# Patient Record
Sex: Male | Born: 1955
Health system: Southern US, Community
[De-identification: ages and names within clinical notes are randomized; demographics above are authoritative.]

## PROBLEM LIST (undated history)

## (undated) DIAGNOSIS — E785 Hyperlipidemia, unspecified: Secondary | ICD-10-CM

## (undated) DIAGNOSIS — I10 Essential (primary) hypertension: Secondary | ICD-10-CM

## (undated) DIAGNOSIS — Z720 Tobacco use: Secondary | ICD-10-CM

## (undated) HISTORY — DX: Hyperlipidemia, unspecified: E78.5

## (undated) HISTORY — DX: Tobacco use: Z72.0

## (undated) HISTORY — DX: Essential (primary) hypertension: I10

---

## 2016-11-25 ENCOUNTER — Observation Stay (HOSPITAL_COMMUNITY)
Admission: EM | Admit: 2016-11-25 | Discharge: 2016-11-26 | Disposition: A | Discharge status: Home / Self Care | Payer: Self-pay | Attending: Internal Medicine | Admitting: Internal Medicine

## 2016-11-25 ENCOUNTER — Emergency Department (HOSPITAL_COMMUNITY): Payer: Self-pay

## 2016-11-25 ENCOUNTER — Encounter (HOSPITAL_COMMUNITY): Payer: Self-pay | Admitting: Internal Medicine

## 2016-11-25 DIAGNOSIS — R55 Syncope and collapse: Secondary | ICD-10-CM

## 2016-11-25 DIAGNOSIS — Z7982 Long term (current) use of aspirin: Secondary | ICD-10-CM | POA: Insufficient documentation

## 2016-11-25 DIAGNOSIS — R0789 Other chest pain: Principal | ICD-10-CM

## 2016-11-25 DIAGNOSIS — I10 Essential (primary) hypertension: Secondary | ICD-10-CM | POA: Insufficient documentation

## 2016-11-25 DIAGNOSIS — Y92410 Unspecified street and highway as the place of occurrence of the external cause: Secondary | ICD-10-CM | POA: Insufficient documentation

## 2016-11-25 DIAGNOSIS — F17210 Nicotine dependence, cigarettes, uncomplicated: Secondary | ICD-10-CM

## 2016-11-25 DIAGNOSIS — R030 Elevated blood-pressure reading, without diagnosis of hypertension: Secondary | ICD-10-CM

## 2016-11-25 DIAGNOSIS — F1010 Alcohol abuse, uncomplicated: Secondary | ICD-10-CM | POA: Insufficient documentation

## 2016-11-25 DIAGNOSIS — Z8249 Family history of ischemic heart disease and other diseases of the circulatory system: Secondary | ICD-10-CM | POA: Insufficient documentation

## 2016-11-25 DIAGNOSIS — Z7289 Other problems related to lifestyle: Secondary | ICD-10-CM

## 2016-11-25 DIAGNOSIS — Z720 Tobacco use: Secondary | ICD-10-CM

## 2016-11-25 DIAGNOSIS — Y9389 Activity, other specified: Secondary | ICD-10-CM | POA: Insufficient documentation

## 2016-11-25 DIAGNOSIS — F17200 Nicotine dependence, unspecified, uncomplicated: Secondary | ICD-10-CM | POA: Insufficient documentation

## 2016-11-25 DIAGNOSIS — R4020 Unspecified coma: Secondary | ICD-10-CM | POA: Diagnosis present

## 2016-11-25 DIAGNOSIS — Z789 Other specified health status: Secondary | ICD-10-CM

## 2016-11-25 LAB — BASIC METABOLIC PANEL
ANION GAP: 12 (ref 5–15)
BUN: 9 mg/dL (ref 6–20)
CO2: 25 mmol/L (ref 22–32)
Calcium: 9.7 mg/dL (ref 8.9–10.3)
Chloride: 107 mmol/L (ref 101–111)
Creatinine, Ser: 0.72 mg/dL (ref 0.61–1.24)
Glucose, Bld: 86 mg/dL (ref 65–99)
POTASSIUM: 3.7 mmol/L (ref 3.5–5.1)
SODIUM: 144 mmol/L (ref 135–145)

## 2016-11-25 LAB — CBC
HEMATOCRIT: 45.4 % (ref 39.0–52.0)
Hemoglobin: 15.2 g/dL (ref 13.0–17.0)
MCH: 30.4 pg (ref 26.0–34.0)
MCHC: 33.5 g/dL (ref 30.0–36.0)
MCV: 90.8 fL (ref 78.0–100.0)
Platelets: 231 10*3/uL (ref 150–400)
RBC: 5 MIL/uL (ref 4.22–5.81)
RDW: 13.5 % (ref 11.5–15.5)
WBC: 6 10*3/uL (ref 4.0–10.5)

## 2016-11-25 LAB — I-STAT TROPONIN, ED: Troponin i, poc: 0 ng/mL (ref 0.00–0.08)

## 2016-11-25 LAB — ETHANOL: Alcohol, Ethyl (B): <5 mg/dL (ref <5)

## 2016-11-25 LAB — TROPONIN I

## 2016-11-25 MED ORDER — HYDROCHLOROTHIAZIDE 12.5 MG PO CAPS
12.5000 mg | ORAL_CAPSULE | Freq: Every day | ORAL | Status: DC
  Administered 2016-11-25 – 2016-11-26 (×2): 12.5 mg via ORAL
  Filled 2016-11-25 (×2): qty 1

## 2016-11-25 MED ORDER — ENOXAPARIN SODIUM 40 MG/0.4ML ~~LOC~~ SOLN
40.0000 mg | SUBCUTANEOUS | Status: DC
  Administered 2016-11-26: 40 mg via SUBCUTANEOUS
  Filled 2016-11-25: qty 0.4

## 2016-11-25 MED ORDER — ADULT MULTIVITAMIN W/MINERALS CH
1.0000 | ORAL_TABLET | Freq: Every day | ORAL | Status: DC
  Administered 2016-11-25 – 2016-11-26 (×2): 1 via ORAL
  Filled 2016-11-25 (×2): qty 1

## 2016-11-25 MED ORDER — LORAZEPAM 1 MG PO TABS: 1.0000 mg | ORAL_TABLET | Freq: Four times a day (QID) | ORAL | Status: DC | PRN

## 2016-11-25 MED ORDER — FOLIC ACID 1 MG PO TABS
1.0000 mg | ORAL_TABLET | Freq: Every day | ORAL | Status: DC
  Administered 2016-11-25 – 2016-11-26 (×2): 1 mg via ORAL
  Filled 2016-11-25 (×2): qty 1

## 2016-11-25 MED ORDER — THIAMINE HCL 100 MG/ML IJ SOLN: 100.0000 mg | Freq: Every day | INTRAMUSCULAR | Status: DC

## 2016-11-25 MED ORDER — ASPIRIN EC 81 MG PO TBEC
81.0000 mg | DELAYED_RELEASE_TABLET | Freq: Once | ORAL | Status: AC
  Administered 2016-11-25: 81 mg via ORAL
  Filled 2016-11-25: qty 1

## 2016-11-25 MED ORDER — NAPROXEN SODIUM 550 MG PO TABS
550.0000 mg | ORAL_TABLET | Freq: Two times a day (BID) | ORAL | Status: DC
  Administered 2016-11-25 – 2016-11-26 (×2): 550 mg via ORAL
  Filled 2016-11-25 (×3): qty 1

## 2016-11-25 MED ORDER — DICLOFENAC SODIUM 1 % TD GEL
2.0000 g | Freq: Four times a day (QID) | TRANSDERMAL | Status: DC
  Filled 2016-11-25: qty 100

## 2016-11-25 MED ORDER — LORAZEPAM 2 MG/ML IJ SOLN: 1.0000 mg | Freq: Four times a day (QID) | INTRAMUSCULAR | Status: DC | PRN

## 2016-11-25 MED ORDER — VITAMIN B-1 100 MG PO TABS
100.0000 mg | ORAL_TABLET | Freq: Every day | ORAL | Status: DC
  Administered 2016-11-25 – 2016-11-26 (×2): 100 mg via ORAL
  Filled 2016-11-25 (×2): qty 1

## 2016-11-25 MED ORDER — NICOTINE 14 MG/24HR TD PT24
14.0000 mg | MEDICATED_PATCH | Freq: Every day | TRANSDERMAL | Status: DC
  Administered 2016-11-25 – 2016-11-26 (×2): 14 mg via TRANSDERMAL
  Filled 2016-11-25 (×2): qty 1
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
## 2016-11-26 DIAGNOSIS — I10 Essential (primary) hypertension: Secondary | ICD-10-CM

## 2016-11-26 DIAGNOSIS — F1410 Cocaine abuse, uncomplicated: Secondary | ICD-10-CM

## 2016-11-26 LAB — URINALYSIS, ROUTINE W REFLEX MICROSCOPIC
BACTERIA UA: NONE SEEN
BILIRUBIN URINE: NEGATIVE
Glucose, UA: 50 mg/dL — AB
Ketones, ur: NEGATIVE mg/dL
LEUKOCYTES UA: NEGATIVE
NITRITE: NEGATIVE
Protein, ur: NEGATIVE mg/dL
SQUAMOUS EPITHELIAL / LPF: NONE SEEN
Specific Gravity, Urine: 1.019 (ref 1.005–1.030)
pH: 7 (ref 5.0–8.0)

## 2016-11-26 LAB — HEPATITIS C ANTIBODY (REFLEX)

## 2016-11-26 LAB — HCV COMMENT:

## 2016-11-26 LAB — HIV ANTIBODY (ROUTINE TESTING W REFLEX): HIV Screen 4th Generation wRfx: NONREACTIVE

## 2016-11-26 LAB — RAPID URINE DRUG SCREEN, HOSP PERFORMED
AMPHETAMINES: NOT DETECTED
Barbiturates: NOT DETECTED
Benzodiazepines: NOT DETECTED
COCAINE: POSITIVE — AB
OPIATES: NOT DETECTED
TETRAHYDROCANNABINOL: NOT DETECTED

## 2016-11-26 LAB — TROPONIN I

## 2016-11-26 LAB — HEMOGLOBIN A1C
Hgb A1c MFr Bld: 5.5 % (ref 4.8–5.6)
Mean Plasma Glucose: 111 mg/dL

## 2016-11-26 MED ORDER — ASPIRIN EC 81 MG PO TBEC: 81.0000 mg | DELAYED_RELEASE_TABLET | Freq: Every day | ORAL | 0 refills | Status: DC

## 2016-11-26 MED ORDER — ADULT MULTIVITAMIN W/MINERALS CH: 1.0000 | ORAL_TABLET | Freq: Every day | ORAL | 0 refills | Status: DC

## 2016-11-26 MED ORDER — HYDROCHLOROTHIAZIDE 12.5 MG PO CAPS
12.5000 mg | ORAL_CAPSULE | Freq: Every day | ORAL | 0 refills | Status: DC
Start: 2016-11-27 — End: 2016-12-23

## 2016-11-26 NOTE — Progress Notes (Signed)
Patient alert and oriented, denies pain, no shortness of breath. Iv and tele d/c. D/c instruction explain and given to the patient, all questions answered. Pt. D/c home with family per order.

## 2016-11-26 NOTE — Discharge Instructions (Signed)
As we discussed please make an appointment with internal medicine clinic located at ground floor of [facility]. I am starting you on a new medicine for your blood pressure and a baby aspirin every day. As we told you, you are at high risk for any heart problem, which needs to be investigated and treated before it causes any damage. Please follow the advice of your primary care.
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
## 2016-11-26 NOTE — Progress Notes (Signed)
Patient rested well overnight. Patient had no episodes of syncope. Patient ambulated to bathroom and tolerated well. Orthostatic vitals WNL. Patient in bed resting.

## 2016-11-29 ENCOUNTER — Telehealth: Payer: Self-pay | Admitting: Internal Medicine

## 2016-11-29 NOTE — Telephone Encounter (Signed)
HFU Appt on 12/12/2016 @ 10:45am per Dr. Nelson ChimesAmin with [facility].

## 2016-11-30 NOTE — Telephone Encounter (Signed)
LM to call back.
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
## 2016-12-12 ENCOUNTER — Ambulatory Visit (INDEPENDENT_AMBULATORY_CARE_PROVIDER_SITE_OTHER): Payer: Self-pay | Admitting: Internal Medicine

## 2016-12-12 VITALS — BP 134/77 | HR 68 | Temp 98.4°F | Wt 165.2 lb

## 2016-12-12 DIAGNOSIS — E782 Mixed hyperlipidemia: Secondary | ICD-10-CM

## 2016-12-12 DIAGNOSIS — Z09 Encounter for follow-up examination after completed treatment for conditions other than malignant neoplasm: Secondary | ICD-10-CM

## 2016-12-12 DIAGNOSIS — Z87898 Personal history of other specified conditions: Secondary | ICD-10-CM

## 2016-12-12 DIAGNOSIS — F17210 Nicotine dependence, cigarettes, uncomplicated: Secondary | ICD-10-CM

## 2016-12-12 DIAGNOSIS — R0789 Other chest pain: Secondary | ICD-10-CM

## 2016-12-12 DIAGNOSIS — Z0000 Encounter for general adult medical examination without abnormal findings: Secondary | ICD-10-CM | POA: Insufficient documentation

## 2016-12-12 DIAGNOSIS — I10 Essential (primary) hypertension: Secondary | ICD-10-CM

## 2016-12-12 DIAGNOSIS — E785 Hyperlipidemia, unspecified: Secondary | ICD-10-CM

## 2016-12-12 DIAGNOSIS — Z720 Tobacco use: Secondary | ICD-10-CM
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
## 2016-12-12 NOTE — Patient Instructions (Addendum)
It was a pleasure meeting you today! I'm glad you are feeling better since discharge.   1. Today we talked about your chest pain. I'm glad you havent had any more since discharge. Please continue taking your blood pressure medication and aspirin. We may need to get a stress test once you have insurance.  2. Today we also talked about your smoking. Please continue trying to quit! 3. I will be obtaining a lipid panel for you today. Please come back in 1-2 months.

## 2016-12-12 NOTE — Assessment & Plan Note (Addendum)
Encouraged cessation and offered nicotine replacement therapy. Endorses 50+ year history of tobacco abuse. Is presently trying to cut back slowly, currently smoking 1/2 pack per day.

## 2016-12-12 NOTE — Assessment & Plan Note (Signed)
Patient has never had colonoscopy. Will need to be scheduled for one once he obtains insurance.

## 2016-12-12 NOTE — Assessment & Plan Note (Signed)
Started on HCTZ 12.5 mg while hospitalized with good result. BP today at target of 134/77.  -Continue HCTZ 12.5 mg daily

## 2016-12-12 NOTE — Assessment & Plan Note (Signed)
No more CP since discharge. Will obtain lipid panel today to risk stratify patient for need of statin/asa therapy. In addition, he does not have insurance however could benefit from outpatient stress test in the next few months -FU lipid panel -Consider outpatient stress test in future

## 2016-12-13 DIAGNOSIS — E785 Hyperlipidemia, unspecified: Secondary | ICD-10-CM | POA: Insufficient documentation

## 2016-12-13 LAB — LIPID PANEL
CHOL/HDL RATIO: 2.3 ratio (ref 0.0–5.0)
Cholesterol, Total: 180 mg/dL (ref 100–199)
HDL: 78 mg/dL (ref >39)
LDL Calculated: 85 mg/dL (ref 0–99)
Triglycerides: 85 mg/dL (ref 0–149)
VLDL Cholesterol Cal: 17 mg/dL (ref 5–40)

## 2016-12-13 MED ORDER — ATORVASTATIN CALCIUM 10 MG PO TABS: 10.0000 mg | ORAL_TABLET | Freq: Every day | ORAL | 5 refills | Status: DC

## 2016-12-13 MED FILL — ATORVASTATIN 10 MG TABLET: 10 | 30 days supply | Qty: 30 | Fill #0

## 2016-12-13 NOTE — Assessment & Plan Note (Addendum)
ASCVD risk 20%. Will be started on moderate intensity statin therapy with Atorvastatin 10 mg daily. These results and plan were discussed with Mr. Cameron Bates 12/13/16 over the phone.   Ref. Range 12/12/2016 09:23  Total CHOL/HDL Ratio Latest Ref Range: 0.0 - 5.0 ratio units 2.3  Cholesterol, Total Latest Ref Range: 100 - 199 mg/dL 161180  HDL Cholesterol Latest Ref Range: >39 mg/dL 78  LDL (calc) Latest Ref Range: 0 - 99 mg/dL 85  Triglycerides Latest Ref Range: 0 - 149 mg/dL 85  VLDL Cholesterol Cal Latest Ref Range: 5 - 40 mg/dL 17

## 2016-12-13 NOTE — Progress Notes (Signed)
Internal Medicine Clinic Attending  Case discussed with Dr. Vincente LibertyMolt at the time of the visit.  We reviewed the resident's history and exam and pertinent patient test results.  I agree with the assessment, diagnosis, and plan of care documented in the resident's note. It appears there was some concern for a cardiac origin of his chest pain when he was an inpatient and he was started on 81mg  of Asprin.  But was not started on a lipid lowering medication.  We obtained a Lipid panel today to help with risk stratification and based on the results Dr Vincente LibertyMolt has started a stain medication, we will need to repeat a lipid panel in about 6 weeks.  It was recommended that he obtain a cardiac stress test however he cannot afford this at this time, therefore we will continue the 81 mg of aspirin.  We will also continue to work on smoking cessation.

## 2016-12-13 NOTE — Addendum Note (Signed)
Addended by: Mable FillMOLT, Geovani Tootle L on: 12/13/2016 09:53 AM   Modules accepted: Orders

## 2016-12-23 ENCOUNTER — Other Ambulatory Visit: Payer: Self-pay | Admitting: Internal Medicine

## 2017-02-08 ENCOUNTER — Other Ambulatory Visit: Payer: Self-pay | Admitting: Internal Medicine

## 2017-02-11 ENCOUNTER — Other Ambulatory Visit: Payer: Self-pay | Admitting: Internal Medicine

## 2017-08-03 ENCOUNTER — Other Ambulatory Visit: Payer: Self-pay | Admitting: Internal Medicine

## 2017-08-25 ENCOUNTER — Other Ambulatory Visit: Payer: Self-pay

## 2017-08-25 ENCOUNTER — Ambulatory Visit (INDEPENDENT_AMBULATORY_CARE_PROVIDER_SITE_OTHER): Payer: Self-pay | Admitting: Internal Medicine

## 2017-08-25 VITALS — BP 127/90 | HR 60 | Temp 97.5°F | Ht 72.0 in | Wt 163.8 lb

## 2017-08-25 DIAGNOSIS — R55 Syncope and collapse: Secondary | ICD-10-CM | POA: Insufficient documentation

## 2017-08-25 DIAGNOSIS — Z720 Tobacco use: Secondary | ICD-10-CM

## 2017-08-25 DIAGNOSIS — F17210 Nicotine dependence, cigarettes, uncomplicated: Secondary | ICD-10-CM

## 2017-08-25 DIAGNOSIS — I10 Essential (primary) hypertension: Secondary | ICD-10-CM

## 2017-08-25 MED ORDER — HYDROCHLOROTHIAZIDE 12.5 MG PO CAPS: 12.5000 mg | ORAL_CAPSULE | Freq: Every day | ORAL | 2 refills | Status: DC

## 2017-08-25 MED ORDER — ASPIRIN 81 MG PO TBEC: 81.0000 mg | DELAYED_RELEASE_TABLET | Freq: Every day | ORAL | 2 refills | Status: DC

## 2017-08-25 MED ORDER — ATORVASTATIN CALCIUM 10 MG PO TABS: 10.0000 mg | ORAL_TABLET | Freq: Every day | ORAL | 2 refills | Status: DC

## 2017-08-25 MED ORDER — NICOTINE 7 MG/24HR TD PT24: 7.0000 mg | MEDICATED_PATCH | Freq: Every day | TRANSDERMAL | 0 refills | Status: DC

## 2017-08-25 MED ORDER — NICOTINE POLACRILEX 2 MG MT GUM: 2.0000 mg | CHEWING_GUM | OROMUCOSAL | 0 refills | Status: DC | PRN

## 2017-08-25 NOTE — Assessment & Plan Note (Addendum)
Patient reports that 2 weeks ago he had an episode of syncope. States that he woke up at night and went to the bathroom. During urination he became lightheaded and dizzy and lost consciousness. Denies chest pain, palpitation, or feeling lightheaded or dizzy before micturition. Denies any tongue biting, loss of bowel or bladder, post-ictal symptoms. Has not had any further symptoms.  A/P Situational syncope with micturition. No further work up warranted at this time.

## 2017-08-25 NOTE — Progress Notes (Signed)
   CC: HTN follow up  HPI:  Mr.Giuseppe Rande Lawmanrwin is a 61 y.o. male with a past medical history listed below here today for follow up of his HTN.   For details of today's visit and the status of his chronic medical issues please refer to the assessment and plan.   Past Medical History:  Diagnosis Date  . Hypertension   . Tobacco abuse     Review of Systems:   No chest pain, shortness of breath, or palpitations  Physical Exam:  Vitals:   08/25/17 0957  BP: 127/90  Pulse: 60  Temp: (!) 97.5 F (36.4 C)  TempSrc: Oral  SpO2: 100%  Weight: 163 lb 12.8 oz (74.3 kg)  Height: 6' (1.829 m)   Physical Exam  Constitutional: He is oriented to person, place, and time and well-developed, well-nourished, and in no distress. No distress.  HENT:  Head: Normocephalic and atraumatic.  Cardiovascular: Normal rate, regular rhythm and normal heart sounds.  Pulmonary/Chest: Effort normal and breath sounds normal.  Abdominal: Soft. Bowel sounds are normal.  Neurological: He is alert and oriented to person, place, and time.  Skin: Skin is warm and dry.  Psychiatric: Mood and affect normal.    Assessment & Plan:   See Encounters Tab for problem based charting.  Patient discussed with Dr. Oswaldo DoneVincent

## 2017-08-25 NOTE — Assessment & Plan Note (Addendum)
BP Readings from Last 3 Encounters:  08/25/17 127/90  12/12/16 134/77  11/26/16 112/66    Lab Results  Component Value Date   NA 144 11/25/2016   K 3.7 11/25/2016   CREATININE 0.72 11/25/2016   BP well controlled today. Currently only taking HCTZ 12.5 mg daily. Denies any chest pain, shortness of breath, palpitations, nausea/vomting. Reports compliance with his medications. Has not had repeat BMET check after starting the HCTZ.  A/P: Continue current medications Check BMET today

## 2017-08-25 NOTE — Assessment & Plan Note (Signed)
Reports smoking 1/2 PPD. Does endorse a desire to quit today. Reports he has never tried to quit in the past and has never tried anything.   Discussed strategies for quitting. Was interested in nicotine patches and gum. Provided information and gave him information on 1-800-quit-now.

## 2017-08-25 NOTE — Patient Instructions (Signed)
Mr. Cameron Bates,  I'm glad you are doing well. We will continue your current medications.   Please follow up with your regular doctor in about 6 months.

## 2017-08-26 LAB — BMP8+ANION GAP
ANION GAP: 18 mmol/L (ref 10.0–18.0)
BUN/Creatinine Ratio: 16 (ref 10–24)
BUN: 12 mg/dL (ref 8–27)
CALCIUM: 10 mg/dL (ref 8.6–10.2)
CO2: 24 mmol/L (ref 20–29)
CREATININE: 0.73 mg/dL — AB (ref 0.76–1.27)
Chloride: 101 mmol/L (ref 96–106)
GFR calc Af Amer: 116 mL/min/{1.73_m2} (ref >59)
GFR, EST NON AFRICAN AMERICAN: 100 mL/min/{1.73_m2} (ref >59)
GLUCOSE: 85 mg/dL (ref 65–99)
Potassium: 3.9 mmol/L (ref 3.5–5.2)
Sodium: 143 mmol/L (ref 134–144)

## 2017-08-28 NOTE — Progress Notes (Signed)
Internal Medicine Clinic Attending  Case discussed with Dr. Boswell at the time of the visit.  We reviewed the resident's history and exam and pertinent patient test results.  I agree with the assessment, diagnosis, and plan of care documented in the resident's note.  

## 2017-10-15 ENCOUNTER — Other Ambulatory Visit: Payer: Self-pay | Admitting: Internal Medicine

## 2017-10-18 NOTE — Telephone Encounter (Signed)
Refill Approved
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
## 2018-06-27 NOTE — Congregational Nurse Program (Signed)
Congregational Nurse Program Note  Date of Encounter: 06/27/2018  Past Medical History: Past Medical History:  Diagnosis Date  . Hypertension   . Tobacco abuse     Encounter Details:  In for blood pressure check and information given on orange card and smoking cessation classes . Client very acceptable! Follow weekly!

## 2018-07-05 NOTE — Congregational Nurse Program (Signed)
Congregational Nurse Program Note  Date of Encounter: 07/04/2018  Past Medical History: Past Medical History:  Diagnosis Date  . Hypertension   . Tobacco abuse     Encounter Details: CNP Questionnaire - 07/05/18 0026      Questionnaire   Patient Status  Not Applicable    Race  Black or African American    Location Patient Served At  Not Applicable    Insurance  Not Applicable    Uninsured  Uninsured (Subsequent visits/quarter)    Food  No food insecurities    Housing/Utilities  Yes, have permanent housing    Transportation  Within past 12 months, lack of transportation negatively impacted life    Interpersonal Safety  Yes, feel physically and emotionally safe where you currently live    Medication  Yes, have medication insecurities    Medical Provider  No    Referrals  Primary Care Provider/Clinic;Area Agency    ED Visit Averted  Not Applicable    Life-Saving Intervention Made  Not Applicable       Congregational Nurse Program Note  Date of Encounter: 07/04/2018  Past Medical History: Past Medical History:  Diagnosis Date  . Hypertension   . Tobacco abuse     Encounter Details: Client in for blood pressure check states he has been okay. Followed up with applying for orange card but need his W2 from 2018. To see if case manager can help him on Internet to secure that documentation. Appointment with PCP next week . Has  Interview for a job at [employer] on tomorrow and hopes he secures it.  To follow up with nurse next week . Blood pressure good ,states he isn' smoking as much because he cant afford the cigarettes and laughed but felt that is good for him and helping him to decrease the amount of smoking.

## 2018-07-13 ENCOUNTER — Ambulatory Visit: Payer: Self-pay | Admitting: Family Medicine

## 2018-11-24 ENCOUNTER — Encounter (HOSPITAL_COMMUNITY): Payer: Self-pay | Admitting: Emergency Medicine

## 2018-11-24 ENCOUNTER — Other Ambulatory Visit: Payer: Self-pay

## 2018-11-24 ENCOUNTER — Emergency Department (HOSPITAL_COMMUNITY)
Admission: EM | Admit: 2018-11-24 | Discharge: 2018-11-24 | Disposition: A | Discharge status: Home / Self Care | Payer: No Typology Code available for payment source | Attending: Emergency Medicine | Admitting: Emergency Medicine

## 2018-11-24 ENCOUNTER — Emergency Department (HOSPITAL_COMMUNITY): Payer: No Typology Code available for payment source

## 2018-11-24 DIAGNOSIS — R69 Illness, unspecified: Secondary | ICD-10-CM

## 2018-11-24 DIAGNOSIS — J111 Influenza due to unidentified influenza virus with other respiratory manifestations: Secondary | ICD-10-CM | POA: Insufficient documentation

## 2018-11-24 DIAGNOSIS — F17210 Nicotine dependence, cigarettes, uncomplicated: Secondary | ICD-10-CM | POA: Insufficient documentation

## 2018-11-24 DIAGNOSIS — I10 Essential (primary) hypertension: Secondary | ICD-10-CM | POA: Insufficient documentation

## 2018-11-24 LAB — URINALYSIS, ROUTINE W REFLEX MICROSCOPIC
Bilirubin Urine: NEGATIVE
Glucose, UA: NEGATIVE mg/dL
Ketones, ur: NEGATIVE mg/dL
Leukocytes, UA: NEGATIVE
Nitrite: NEGATIVE
Protein, ur: 30 mg/dL — AB
Specific Gravity, Urine: 1.014 (ref 1.005–1.030)
pH: 5 (ref 5.0–8.0)

## 2018-11-24 LAB — COMPREHENSIVE METABOLIC PANEL
ALT: 22 U/L (ref 0–44)
AST: 45 U/L — ABNORMAL HIGH (ref 15–41)
Albumin: 4.1 g/dL (ref 3.5–5.0)
Alkaline Phosphatase: 47 U/L (ref 38–126)
Anion gap: 13 (ref 5–15)
BUN: 15 mg/dL (ref 8–23)
CO2: 21 mmol/L — ABNORMAL LOW (ref 22–32)
Calcium: 8.9 mg/dL (ref 8.9–10.3)
Chloride: 99 mmol/L (ref 98–111)
Creatinine, Ser: 1.69 mg/dL — ABNORMAL HIGH (ref 0.61–1.24)
GFR calc Af Amer: 49 mL/min — ABNORMAL LOW (ref >60)
GFR calc non Af Amer: 42 mL/min — ABNORMAL LOW (ref >60)
Glucose, Bld: 123 mg/dL — ABNORMAL HIGH (ref 70–99)
Potassium: 3.4 mmol/L — ABNORMAL LOW (ref 3.5–5.1)
Sodium: 133 mmol/L — ABNORMAL LOW (ref 135–145)
Total Bilirubin: 1 mg/dL (ref 0.3–1.2)
Total Protein: 7.8 g/dL (ref 6.5–8.1)

## 2018-11-24 LAB — CBC WITH DIFFERENTIAL/PLATELET
Abs Immature Granulocytes: 0.05 10*3/uL (ref 0.00–0.07)
Basophils Absolute: 0 10*3/uL (ref 0.0–0.1)
Basophils Relative: 0 %
Eosinophils Absolute: 0 10*3/uL (ref 0.0–0.5)
Eosinophils Relative: 0 %
HCT: 45.2 % (ref 39.0–52.0)
Hemoglobin: 14.5 g/dL (ref 13.0–17.0)
Immature Granulocytes: 1 %
Lymphocytes Relative: 10 %
Lymphs Abs: 1 10*3/uL (ref 0.7–4.0)
MCH: 30.4 pg (ref 26.0–34.0)
MCHC: 32.1 g/dL (ref 30.0–36.0)
MCV: 94.8 fL (ref 80.0–100.0)
Monocytes Absolute: 0.6 10*3/uL (ref 0.1–1.0)
Monocytes Relative: 6 %
Neutro Abs: 8.1 10*3/uL — ABNORMAL HIGH (ref 1.7–7.7)
Neutrophils Relative %: 83 %
Platelets: 161 10*3/uL (ref 150–400)
RBC: 4.77 MIL/uL (ref 4.22–5.81)
RDW: 13.4 % (ref 11.5–15.5)
WBC: 9.7 10*3/uL (ref 4.0–10.5)
nRBC: 0 % (ref 0.0–0.2)

## 2018-11-24 LAB — CBG MONITORING, ED: Glucose-Capillary: 156 mg/dL — ABNORMAL HIGH (ref 70–99)

## 2018-11-24 MED ORDER — SODIUM CHLORIDE 0.9 % IV BOLUS
1000.0000 mL | Freq: Once | INTRAVENOUS | Status: AC
  Administered 2018-11-24: 1000 mL via INTRAVENOUS

## 2018-11-24 MED ORDER — PROMETHAZINE HCL 25 MG PO TABS: 25.0000 mg | ORAL_TABLET | Freq: Four times a day (QID) | ORAL | 0 refills | Status: DC | PRN

## 2018-11-24 NOTE — ED Notes (Signed)
Ambulated around desk x 2, tol well sats mid 90's with HR 85-95 BP 114/72 upon returning to room.

## 2018-11-24 NOTE — ED Triage Notes (Signed)
Pt c/o sore throat for couple days. Reports he past out at work couple days ago and vomited afterwards. Also had some diarrhea and headaches. Pt reports that had a cough and getting up sputum.

## 2018-11-24 NOTE — ED Notes (Signed)
Patient transported to X-ray 

## 2018-11-24 NOTE — ED Notes (Signed)
Pt refuses RN to hang IV fluids until fed.  Spoke with provider.  Able to give something to eat. Sandwich and drink provided.

## 2018-11-24 NOTE — ED Provider Notes (Signed)
[facility]-EMERGENCY DEPT Provider Note   CSN: [id number] Arrival date & time: 11/24/18  3646     History   Chief Complaint Chief Complaint  Patient presents with  . Sore Throat  . multiple complaints    HPI Cameron Bates is a 63 y.o. male.  HPI Patient presents to the emergency department with cough, body aches nausea and vomiting.  The patient states he has had one episode of diarrhea yesterday.  Patient states that the symptoms started 2 days ago.  Patient states that yesterday while he was standing in line yesterday afternoon he passed out for a few seconds.  Patient states that he said that happened to him in the past.  Patient states he feels some better today but was slightly dizzy.  Patient states he is not eaten in 2 days.  The patient denies chest pain, shortness of breath, headache,blurred vision, neck pain, fever,  weakness, numbness, dizziness, anorexia, edema, abdominal pain, diarrhea, rash, back pain, dysuria, hematemesis, bloody stool,  Past Medical History:  Diagnosis Date  . Hypertension   . Tobacco abuse     Patient Active Problem List   Diagnosis Date Noted  . Situational syncope 08/25/2017  . Hyperlipidemia 12/13/2016  . Healthcare maintenance 12/12/2016  . Essential hypertension 12/12/2016  . Loss of consciousness (HCC) 11/25/2016  . Tobacco use 11/25/2016  . Alcohol use 11/25/2016  . Chest wall pain 11/25/2016    History reviewed. No pertinent surgical history.      Home Medications    Prior to Admission medications   Medication Sig Start Date End Date Taking? Authorizing Provider  aspirin 81 MG EC tablet Take 1 tablet (81 mg total) daily by mouth. Swallow whole. Patient not taking: Reported on 11/24/2018 08/25/17   Valentino Nose, MD  atorvastatin (LIPITOR) 10 MG tablet Take 1 tablet (10 mg total) daily by mouth. 08/25/17 05/22/18  Valentino Nose, MD  hydrochlorothiazide (MICROZIDE) 12.5 MG capsule Take 1 capsule (12.5 mg  total) daily by mouth. Patient not taking: Reported on 11/24/2018 08/25/17   Valentino Nose, MD  Multiple Vitamin (MULTIVITAMIN WITH MINERALS) TABS tablet Take 1 tablet by mouth daily. Patient not taking: Reported on 11/24/2018 11/27/16   Arnetha Courser, MD  nicotine (NICODERM CQ - DOSED IN MG/24 HR) 7 mg/24hr patch Place 1 patch (7 mg total) daily onto the skin. Patient not taking: Reported on 11/24/2018 08/25/17   Valentino Nose, MD  nicotine polacrilex (NICORETTE) 2 MG gum TAKE 1 EACH (2 MG TOTAL) AS NEEDED BY MOUTH FOR SMOKING CESSATION. Patient not taking: Reported on 11/24/2018 10/18/17   Beola Cord, MD    Family History Family History  Problem Relation Age of Onset  . Hypertension Mother     Social History Social History   Tobacco Use  . Smoking status: Current Every Day Smoker    Packs/day: 0.50    Years: 40.00    Pack years: 20.00    Types: Cigarettes  . Smokeless tobacco: Never Used  Substance Use Topics  . Alcohol use: Yes  . Drug use: No     Allergies   Patient has no known allergies.   Review of Systems Review of Systems All other systems negative except as documented in the HPI. All pertinent positives and negatives as reviewed in the HPI.  Physical Exam Updated Vital Signs BP 102/74 Comment: Sat 99 %  Pulse 74   Temp 98.7 F (37.1 C) (Oral)   Resp 17 Comment: Sat 99 %  Ht 6' (  1.829 m)   Wt 73.9 kg   SpO2 97%   BMI 22.11 kg/m   Physical Exam Vitals signs and nursing note reviewed.  Constitutional:      General: He is not in acute distress.    Appearance: He is well-developed.  HENT:     Head: Normocephalic and atraumatic.  Eyes:     Pupils: Pupils are equal, round, and reactive to light.  Neck:     Musculoskeletal: Normal range of motion and neck supple.  Cardiovascular:     Rate and Rhythm: Normal rate and regular rhythm.     Heart sounds: Normal heart sounds. No murmur. No friction rub. No gallop.   Pulmonary:     Effort: Pulmonary effort  is normal. No respiratory distress.     Breath sounds: Normal breath sounds. No wheezing.  Abdominal:     General: Bowel sounds are normal. There is no distension.     Palpations: Abdomen is soft.     Tenderness: There is no abdominal tenderness.  Skin:    General: Skin is warm and dry.     Capillary Refill: Capillary refill takes less than 2 seconds.     Findings: No erythema or rash.  Neurological:     Mental Status: He is alert and oriented to person, place, and time.     Motor: No abnormal muscle tone.     Coordination: Coordination normal.  Psychiatric:        Behavior: Behavior normal.      ED Treatments / Results  Labs (all labs ordered are listed, but only abnormal results are displayed) Labs Reviewed  COMPREHENSIVE METABOLIC PANEL - Abnormal; Notable for the following components:      Result Value   Sodium 133 (*)    Potassium 3.4 (*)    CO2 21 (*)    Glucose, Bld 123 (*)    Creatinine, Ser 1.69 (*)    AST 45 (*)    GFR calc non Af Amer 42 (*)    GFR calc Af Amer 49 (*)    All other components within normal limits  CBC WITH DIFFERENTIAL/PLATELET - Abnormal; Notable for the following components:   Neutro Abs 8.1 (*)    All other components within normal limits  URINALYSIS, ROUTINE W REFLEX MICROSCOPIC - Abnormal; Notable for the following components:   APPearance CLOUDY (*)    Hgb urine dipstick LARGE (*)    Protein, ur 30 (*)    Bacteria, UA RARE (*)    All other components within normal limits  CBG MONITORING, ED - Abnormal; Notable for the following components:   Glucose-Capillary 156 (*)    All other components within normal limits    EKG EKG Interpretation  Date/Time:  Saturday November 24 2018 07:05:30 EST Ventricular Rate:  101 PR Interval:    QRS Duration: 72 QT Interval:  291 QTC Calculation: 378 R Axis:   71 Text Interpretation:  Sinus tachycardia Biatrial enlargement Left ventricular hypertrophy Nonspecific T abnormalities, lateral leads  Confirmed by Lorre NickAllen, Anthony ([id number]) on 11/24/2018 10:29:28 AM   Radiology Dg Chest 2 View  Result Date: 11/24/2018 CLINICAL DATA:  Cough and sore throat EXAM: CHEST - 2 VIEW COMPARISON:  None. FINDINGS: Lungs are clear. Heart size and pulmonary vascularity are normal. No adenopathy. There is aortic atherosclerosis. No bone lesions. IMPRESSION: Aortic atherosclerosis.  No edema or consolidation. Electronically Signed   By: Bretta BangWilliam  Woodruff III M.D.   On: 11/24/2018 09:40    Procedures Procedures (  including critical care time)  Medications Ordered in ED Medications  sodium chloride 0.9 % bolus 1,000 mL (0 mLs Intravenous Stopped 11/24/18 0924)  sodium chloride 0.9 % bolus 1,000 mL (1,000 mLs Intravenous New Bag/Given 11/24/18 1236)     Initial Impression / Assessment and Plan / ED Course  I have reviewed the triage vital signs and the nursing notes.  Pertinent labs & imaging results that were available during my care of the patient were reviewed by me and considered in my medical decision making (see chart for details).     Patient is feeling much better following fluids and eating.  The patient states he would like to be discharged home.  The patient's blood pressures have been low but after he was ambulated and rechecked his blood pressure is 114.  The patient most likely has an influenza-like illness.  I feel that the 2 L of fluid will definitely help his condition.  His creatinine was elevated but I feel this is due to dehydration. Patient is advised to return here as needed.   Final Clinical Impressions(s) / ED Diagnoses   Final diagnoses:  None    ED Discharge Orders    None       Charlestine NightLawyer, Eilan Mcinerny, PA-C 11/24/18 1444    Lorre NickAllen, Anthony, MD 11/24/18 [phone]

## 2018-11-24 NOTE — ED Notes (Signed)
Pt given urinal and made aware that a urine sample is needed.

## 2018-11-24 NOTE — Discharge Instructions (Addendum)
Return here as needed.  Follow-up with the clinic provided.  Increase your fluid intake.

## 2018-11-25 ENCOUNTER — Emergency Department (HOSPITAL_COMMUNITY)
Admission: EM | Admit: 2018-11-25 | Discharge: 2018-11-25 | Disposition: A | Discharge status: Home / Self Care | Payer: Self-pay | Attending: Emergency Medicine | Admitting: Emergency Medicine

## 2018-11-25 ENCOUNTER — Emergency Department (HOSPITAL_COMMUNITY): Payer: Self-pay

## 2018-11-25 ENCOUNTER — Encounter (HOSPITAL_COMMUNITY): Payer: Self-pay | Admitting: Emergency Medicine

## 2018-11-25 ENCOUNTER — Other Ambulatory Visit: Payer: Self-pay

## 2018-11-25 DIAGNOSIS — Z79899 Other long term (current) drug therapy: Secondary | ICD-10-CM | POA: Insufficient documentation

## 2018-11-25 DIAGNOSIS — I10 Essential (primary) hypertension: Secondary | ICD-10-CM | POA: Insufficient documentation

## 2018-11-25 DIAGNOSIS — E860 Dehydration: Secondary | ICD-10-CM

## 2018-11-25 DIAGNOSIS — J101 Influenza due to other identified influenza virus with other respiratory manifestations: Secondary | ICD-10-CM | POA: Insufficient documentation

## 2018-11-25 DIAGNOSIS — R531 Weakness: Secondary | ICD-10-CM

## 2018-11-25 DIAGNOSIS — F17210 Nicotine dependence, cigarettes, uncomplicated: Secondary | ICD-10-CM | POA: Insufficient documentation

## 2018-11-25 DIAGNOSIS — R109 Unspecified abdominal pain: Secondary | ICD-10-CM | POA: Insufficient documentation

## 2018-11-25 LAB — CBC WITH DIFFERENTIAL/PLATELET
ABS IMMATURE GRANULOCYTES: 0.02 10*3/uL (ref 0.00–0.07)
Basophils Absolute: 0 10*3/uL (ref 0.0–0.1)
Basophils Relative: 0 %
Eosinophils Absolute: 0 10*3/uL (ref 0.0–0.5)
Eosinophils Relative: 0 %
HCT: 43.7 % (ref 39.0–52.0)
Hemoglobin: 13.3 g/dL (ref 13.0–17.0)
Immature Granulocytes: 0 %
Lymphocytes Relative: 14 %
Lymphs Abs: 0.8 10*3/uL (ref 0.7–4.0)
MCH: 28.9 pg (ref 26.0–34.0)
MCHC: 30.4 g/dL (ref 30.0–36.0)
MCV: 95 fL (ref 80.0–100.0)
MONO ABS: 0.4 10*3/uL (ref 0.1–1.0)
Monocytes Relative: 8 %
NEUTROS ABS: 4.2 10*3/uL (ref 1.7–7.7)
Neutrophils Relative %: 78 %
Platelets: 142 10*3/uL — ABNORMAL LOW (ref 150–400)
RBC: 4.6 MIL/uL (ref 4.22–5.81)
RDW: 13 % (ref 11.5–15.5)
WBC: 5.5 10*3/uL (ref 4.0–10.5)
nRBC: 0 % (ref 0.0–0.2)

## 2018-11-25 LAB — URINALYSIS, ROUTINE W REFLEX MICROSCOPIC
Bacteria, UA: NONE SEEN
Bilirubin Urine: NEGATIVE
Glucose, UA: NEGATIVE mg/dL
Ketones, ur: NEGATIVE mg/dL
Leukocytes, UA: NEGATIVE
Nitrite: NEGATIVE
Protein, ur: NEGATIVE mg/dL
Specific Gravity, Urine: 1.01 (ref 1.005–1.030)
pH: 5 (ref 5.0–8.0)

## 2018-11-25 LAB — COMPREHENSIVE METABOLIC PANEL
ALT: 25 U/L (ref 0–44)
AST: 43 U/L — ABNORMAL HIGH (ref 15–41)
Albumin: 3.2 g/dL — ABNORMAL LOW (ref 3.5–5.0)
Alkaline Phosphatase: 37 U/L — ABNORMAL LOW (ref 38–126)
Anion gap: 13 (ref 5–15)
BUN: 14 mg/dL (ref 8–23)
CHLORIDE: 98 mmol/L (ref 98–111)
CO2: 22 mmol/L (ref 22–32)
Calcium: 8.4 mg/dL — ABNORMAL LOW (ref 8.9–10.3)
Creatinine, Ser: 1.21 mg/dL (ref 0.61–1.24)
GFR calc Af Amer: >60 mL/min (ref >60)
GFR calc non Af Amer: >60 mL/min (ref >60)
Glucose, Bld: 104 mg/dL — ABNORMAL HIGH (ref 70–99)
POTASSIUM: 3.4 mmol/L — AB (ref 3.5–5.1)
Sodium: 133 mmol/L — ABNORMAL LOW (ref 135–145)
Total Bilirubin: 0.8 mg/dL (ref 0.3–1.2)
Total Protein: 6.7 g/dL (ref 6.5–8.1)

## 2018-11-25 LAB — INFLUENZA PANEL BY PCR (TYPE A & B)
Influenza A By PCR: POSITIVE — AB
Influenza B By PCR: NEGATIVE

## 2018-11-25 LAB — I-STAT TROPONIN, ED: Troponin i, poc: 0.01 ng/mL (ref 0.00–0.08)

## 2018-11-25 LAB — LACTIC ACID, PLASMA: LACTIC ACID, VENOUS: 1.1 mmol/L (ref 0.5–1.9)

## 2018-11-25 MED ORDER — IOHEXOL 300 MG/ML  SOLN
100.0000 mL | Freq: Once | INTRAMUSCULAR | Status: AC | PRN
  Administered 2018-11-25: 100 mL via INTRAVENOUS

## 2018-11-25 MED ORDER — OSELTAMIVIR PHOSPHATE 75 MG PO CAPS
75.0000 mg | ORAL_CAPSULE | Freq: Once | ORAL | Status: AC
  Administered 2018-11-25: 75 mg via ORAL
  Filled 2018-11-25: qty 1

## 2018-11-25 MED ORDER — POTASSIUM CHLORIDE CRYS ER 20 MEQ PO TBCR
40.0000 meq | EXTENDED_RELEASE_TABLET | Freq: Once | ORAL | Status: AC
  Administered 2018-11-25: 40 meq via ORAL
  Filled 2018-11-25: qty 2

## 2018-11-25 MED ORDER — SODIUM CHLORIDE 0.9 % IV BOLUS
1000.0000 mL | Freq: Once | INTRAVENOUS | Status: AC
  Administered 2018-11-25: 1000 mL via INTRAVENOUS

## 2018-11-25 MED ORDER — OSELTAMIVIR PHOSPHATE 75 MG PO CAPS: 75.0000 mg | ORAL_CAPSULE | Freq: Two times a day (BID) | ORAL | 0 refills | Status: DC

## 2018-11-25 NOTE — Discharge Instructions (Addendum)
It was our pleasure to provide your ER care today - we hope that you feel better.  Rest. Drink plenty of fluids.  Your flu test is positive. Take tamiflu as prescribed.   Hold/do not take your blood pressure medication until follow up with your doctor and recheck of blood pressure.  From today's labs, your potassium level is slightly low  - eat plenty of fruits and vegetables, and follow up with primary care doctor.   Return to ER if worse, new symptoms, increased trouble breathing, weak/fainting, other  concern.   Follow up with primary care doctor in the next few days.

## 2018-11-25 NOTE — ED Provider Notes (Signed)
[facility] Provider Note   CSN: [id number] Arrival date & time: 11/25/18  1948     History   Chief Complaint Chief Complaint  Patient presents with  . Weakness    HPI Cameron Bates is a 63 y.o. male.  Patient with general weakness in the past few days. Symptoms gradual onset, constant, mod-severe, persistent. [facility] - no specific dx made. Patient notes this past week had non prod cough, mild sore throat, congestion - states thought he may have had flu. States bp low yesterday and today, and that normally bp is high. 2 days ago notes syncopal event when stood up. No associated cp, no palpitations. States while standing in line, began to feel faint/lightheaded, and then syncopal event. States compliant w normal meds, denies any new meds or change in meds. Normal appetite. No nvd. Denies fever/chills. No headache. No chest pain or discomfort. No sob. Does note bil flank pain. No dysuria or hematuria. Denies any recent blood loss, rectal bleeding or melena.   The history is provided by the patient.  Weakness  Associated symptoms: cough and myalgias   Associated symptoms: no abdominal pain, no chest pain, no diarrhea, no dysuria, no fever, no headaches, no shortness of breath and no vomiting     Past Medical History:  Diagnosis Date  . Hypertension   . Tobacco abuse     Patient Active Problem List   Diagnosis Date Noted  . Situational syncope 08/25/2017  . Hyperlipidemia 12/13/2016  . Healthcare maintenance 12/12/2016  . Essential hypertension 12/12/2016  . Loss of consciousness (HCC) 11/25/2016  . Tobacco use 11/25/2016  . Alcohol use 11/25/2016  . Chest wall pain 11/25/2016    History reviewed. No pertinent surgical history.      Home Medications    Prior to Admission medications   Medication Sig Start Date End Date Taking? Authorizing Provider  aspirin 81 MG EC tablet Take 1 tablet (81 mg total)  daily by mouth. Swallow whole. Patient not taking: Reported on 11/24/2018 08/25/17   Valentino NoseBoswell, Nathan, MD  atorvastatin (LIPITOR) 10 MG tablet Take 1 tablet (10 mg total) daily by mouth. 08/25/17 05/22/18  Valentino NoseBoswell, Nathan, MD  hydrochlorothiazide (MICROZIDE) 12.5 MG capsule Take 1 capsule (12.5 mg total) daily by mouth. Patient not taking: Reported on 11/24/2018 08/25/17   Valentino NoseBoswell, Nathan, MD  Multiple Vitamin (MULTIVITAMIN WITH MINERALS) TABS tablet Take 1 tablet by mouth daily. Patient not taking: Reported on 11/24/2018 11/27/16   Arnetha CourserAmin, Sumayya, MD  nicotine (NICODERM CQ - DOSED IN MG/24 HR) 7 mg/24hr patch Place 1 patch (7 mg total) daily onto the skin. Patient not taking: Reported on 11/24/2018 08/25/17   Valentino NoseBoswell, Nathan, MD  nicotine polacrilex (NICORETTE) 2 MG gum TAKE 1 EACH (2 MG TOTAL) AS NEEDED BY MOUTH FOR SMOKING CESSATION. Patient not taking: Reported on 11/24/2018 10/18/17   Beola CordMelvin, Alexander, MD  promethazine (PHENERGAN) 25 MG tablet Take 1 tablet (25 mg total) by mouth every 6 (six) hours as needed for nausea or vomiting. 11/24/18   Charlestine NightLawyer, Christopher, PA-C    Family History Family History  Problem Relation Age of Onset  . Hypertension Mother     Social History Social History   Tobacco Use  . Smoking status: Current Every Day Smoker    Packs/day: 0.50    Years: 40.00    Pack years: 20.00    Types: Cigarettes  . Smokeless tobacco: Never Used  Substance Use Topics  .  Alcohol use: Yes  . Drug use: No     Allergies   Patient has no known allergies.   Review of Systems Review of Systems  Constitutional: Negative for fever.  HENT: Negative for rhinorrhea.   Eyes: Negative for visual disturbance.  Respiratory: Positive for cough. Negative for shortness of breath.   Cardiovascular: Negative for chest pain, palpitations and leg swelling.  Gastrointestinal: Negative for abdominal pain, blood in stool, diarrhea and vomiting.  Endocrine: Negative for polyuria.  Genitourinary: Positive  for flank pain. Negative for dysuria and hematuria.  Musculoskeletal: Positive for myalgias. Negative for neck pain and neck stiffness.  Skin: Negative for rash.  Neurological: Positive for weakness. Negative for speech difficulty, numbness and headaches.  Hematological: Does not bruise/bleed easily.  Psychiatric/Behavioral: Negative for confusion.     Physical Exam Updated Vital Signs BP (!) 81/63 (BP Location: Left Arm)   Pulse 88   Temp 98.4 F (36.9 C) (Oral)   Resp 18   SpO2 98%   Physical Exam Vitals signs and nursing note reviewed.  Constitutional:      Appearance: Normal appearance. He is well-developed.  HENT:     Head: Atraumatic.     Nose: Nose normal.     Mouth/Throat:     Mouth: Mucous membranes are moist.     Pharynx: Oropharynx is clear.  Eyes:     General: No scleral icterus.    Conjunctiva/sclera: Conjunctivae normal.     Pupils: Pupils are equal, round, and reactive to light.  Neck:     Musculoskeletal: Normal range of motion and neck supple. No neck rigidity.     Trachea: No tracheal deviation.  Cardiovascular:     Rate and Rhythm: Normal rate and regular rhythm.     Pulses: Normal pulses.     Heart sounds: Normal heart sounds. No murmur. No friction rub. No gallop.   Pulmonary:     Effort: Pulmonary effort is normal. No accessory muscle usage or respiratory distress.     Breath sounds: Normal breath sounds.  Abdominal:     General: Bowel sounds are normal. There is no distension.     Palpations: Abdomen is soft.     Tenderness: There is no abdominal tenderness. There is no guarding.  Genitourinary:    Comments: No cva tenderness. Musculoskeletal:        General: No swelling.     Comments: CTLS spine, non tender, aligned, no step off. Good rom bil ext, no focal bony tenderness.   Skin:    General: Skin is warm and dry.     Findings: No rash.  Neurological:     Mental Status: He is alert.     Comments: Alert, speech clear/fluent. Motor/sens  grossly intact bil. Steady gait.   Psychiatric:        Mood and Affect: Mood normal.      ED Treatments / Results  Labs (all labs ordered are listed, but only abnormal results are displayed) Results for orders placed or performed during the hospital encounter of 11/25/18  Lactic acid, plasma  Result Value Ref Range   Lactic Acid, Venous 1.1 0.5 - 1.9 mmol/L  Comprehensive metabolic panel  Result Value Ref Range   Sodium 133 (L) 135 - 145 mmol/L   Potassium 3.4 (L) 3.5 - 5.1 mmol/L   Chloride 98 98 - 111 mmol/L   CO2 22 22 - 32 mmol/L   Glucose, Bld 104 (H) 70 - 99 mg/dL   BUN 14 8 -  23 mg/dL   Creatinine, Ser 1.611.21 0.61 - 1.24 mg/dL   Calcium 8.4 (L) 8.9 - 10.3 mg/dL   Total Protein 6.7 6.5 - 8.1 g/dL   Albumin 3.2 (L) 3.5 - 5.0 g/dL   AST 43 (H) 15 - 41 U/L   ALT 25 0 - 44 U/L   Alkaline Phosphatase 37 (L) 38 - 126 U/L   Total Bilirubin 0.8 0.3 - 1.2 mg/dL   GFR calc non Af Amer >60 >60 mL/min   GFR calc Af Amer >60 >60 mL/min   Anion gap 13 5 - 15  CBC WITH DIFFERENTIAL  Result Value Ref Range   WBC 5.5 4.0 - 10.5 K/uL   RBC 4.60 4.22 - 5.81 MIL/uL   Hemoglobin 13.3 13.0 - 17.0 g/dL   HCT 09.643.7 04.539.0 - 40.952.0 %   MCV 95.0 80.0 - 100.0 fL   MCH 28.9 26.0 - 34.0 pg   MCHC 30.4 30.0 - 36.0 g/dL   RDW 81.113.0 91.411.5 - 78.215.5 %   Platelets 142 (L) 150 - 400 K/uL   nRBC 0.0 0.0 - 0.2 %   Neutrophils Relative % 78 %   Neutro Abs 4.2 1.7 - 7.7 K/uL   Lymphocytes Relative 14 %   Lymphs Abs 0.8 0.7 - 4.0 K/uL   Monocytes Relative 8 %   Monocytes Absolute 0.4 0.1 - 1.0 K/uL   Eosinophils Relative 0 %   Eosinophils Absolute 0.0 0.0 - 0.5 K/uL   Basophils Relative 0 %   Basophils Absolute 0.0 0.0 - 0.1 K/uL   Immature Granulocytes 0 %   Abs Immature Granulocytes 0.02 0.00 - 0.07 K/uL  Urinalysis, Routine w reflex microscopic  Result Value Ref Range   Color, Urine YELLOW YELLOW   APPearance CLEAR CLEAR   Specific Gravity, Urine 1.010 1.005 - 1.030   pH 5.0 5.0 - 8.0   Glucose,  UA NEGATIVE NEGATIVE mg/dL   Hgb urine dipstick MODERATE (A) NEGATIVE   Bilirubin Urine NEGATIVE NEGATIVE   Ketones, ur NEGATIVE NEGATIVE mg/dL   Protein, ur NEGATIVE NEGATIVE mg/dL   Nitrite NEGATIVE NEGATIVE   Leukocytes, UA NEGATIVE NEGATIVE   RBC / HPF 0-5 0 - 5 RBC/hpf   WBC, UA 0-5 0 - 5 WBC/hpf   Bacteria, UA NONE SEEN NONE SEEN   Mucus PRESENT   Influenza panel by PCR (type A & B)  Result Value Ref Range   Influenza A By PCR POSITIVE (A) NEGATIVE   Influenza B By PCR NEGATIVE NEGATIVE  I-stat troponin, ED ([facility])  Result Value Ref Range   Troponin i, poc 0.01 0.00 - 0.08 ng/mL   Comment 3           Dg Chest 2 View  Result Date: 11/24/2018 CLINICAL DATA:  Cough and sore throat EXAM: CHEST - 2 VIEW COMPARISON:  None. FINDINGS: Lungs are clear. Heart size and pulmonary vascularity are normal. No adenopathy. There is aortic atherosclerosis. No bone lesions. IMPRESSION: Aortic atherosclerosis.  No edema or consolidation. Electronically Signed   By: Bretta BangWilliam  Woodruff III M.D.   On: 11/24/2018 09:40   Ct Abdomen Pelvis W Contrast  Result Date: 11/25/2018 CLINICAL DATA:  63 y/o M; generalized weakness and body aches for 3 days. EXAM: CT ABDOMEN AND PELVIS WITH CONTRAST TECHNIQUE: Multidetector CT imaging of the abdomen and pelvis was performed using the standard protocol following bolus administration of intravenous contrast. CONTRAST:  100mL OMNIPAQUE IOHEXOL 300 MG/ML  SOLN COMPARISON:  None. FINDINGS:  Lower chest: No acute abnormality. Hepatobiliary: No focal liver abnormality is seen. No gallstones, gallbladder wall thickening, or biliary dilatation. Pancreas: Unremarkable. No pancreatic ductal dilatation or surrounding inflammatory changes. Spleen: Normal in size without focal abnormality. Adrenals/Urinary Tract: Adrenal glands are unremarkable. Kidneys are normal, without renal calculi, focal lesion, or hydronephrosis. Bladder is unremarkable. Stomach/Bowel: Stomach is within  normal limits. Appendix not identified, no pericecal inflammation. No evidence of bowel wall thickening, distention, or inflammatory changes. Vascular/Lymphatic: Aortic atherosclerosis. No enlarged abdominal or pelvic lymph nodes. Reproductive: Prostate enlargement. Other: No abdominal wall hernia or abnormality. No abdominopelvic ascites. Musculoskeletal: No acute or significant osseous findings. IMPRESSION: 1. No acute process identified. 2. Prostate enlargement. 3. Aortic Atherosclerosis (ICD10-I70.0). Electronically Signed   By: Mitzi Hansen M.D.   On: 11/25/2018 22:19    EKG EKG Interpretation  Date/Time:  Sunday November 25 2018 20:09:52 EST Ventricular Rate:  84 PR Interval:    QRS Duration: 80 QT Interval:  342 QTC Calculation: 405 R Axis:   72 Text Interpretation:  Sinus rhythm Left ventricular hypertrophy Non-specific ST-t changes No significant change since last tracing Confirmed by Cathren Laine ([id number]) on 11/25/2018 8:18:49 PM   Radiology Dg Chest 2 View  Result Date: 11/24/2018 CLINICAL DATA:  Cough and sore throat EXAM: CHEST - 2 VIEW COMPARISON:  None. FINDINGS: Lungs are clear. Heart size and pulmonary vascularity are normal. No adenopathy. There is aortic atherosclerosis. No bone lesions. IMPRESSION: Aortic atherosclerosis.  No edema or consolidation. Electronically Signed   By: Bretta Bang III M.D.   On: 11/24/2018 09:40    Procedures Procedures (including critical care time)  Medications Ordered in ED Medications - No data to display   Initial Impression / Assessment and Plan / ED Course  I have reviewed the triage vital signs and the nursing notes.  Pertinent labs & imaging results that were available during my care of the patient were reviewed by me and considered in my medical decision making (see chart for details).  Iv ns bolus. Labs.   Reviewed nursing notes and prior charts for additional history.   Cultures sent. Additional imaging  ordered re bil flank pain/myalgias.   Pt refuses repeat cxr today - xray from yesterday neg acute.   Labs reviewed - lactate is normal. Renal fxn sl improved from yesterday.   Ct reviewed - no acute process.  bp is improved.   Po fluids. Food. Additional ivf.   Recheck bp 112/79.   Subsequent labs return - flu positive. Given recent worsening symptoms, will tx with tamiflu.   Pt feels much improved from prior. bp normal.        Final Clinical Impressions(s) / ED Diagnoses   Final diagnoses:  None    ED Discharge Orders    None       Cathren Laine, MD 11/25/18 2229

## 2018-11-25 NOTE — ED Triage Notes (Signed)
C/o generalized weakness and generalized body aches since Thursday.  Reports syncopal episode on Friday.  States he was seen at [facility] yesterday and everything was normal except low BP.  Pt diagnosed with influenza-like illness.

## 2018-11-25 NOTE — ED Notes (Signed)
Patient verbalizes understanding of discharge instructions. Opportunity for questioning and answers were provided. Armband removed by staff, pt discharged from ED.  

## 2018-11-30 LAB — CULTURE, BLOOD (ROUTINE X 2)
Culture: NO GROWTH
Culture: NO GROWTH
SPECIAL REQUESTS: ADEQUATE
Special Requests: ADEQUATE

## 2019-09-10 ENCOUNTER — Other Ambulatory Visit: Payer: Self-pay

## 2019-09-10 DIAGNOSIS — Z20822 Contact with and (suspected) exposure to covid-19: Secondary | ICD-10-CM

## 2019-09-12 LAB — NOVEL CORONAVIRUS, NAA: SARS-CoV-2, NAA: NOT DETECTED

## 2019-09-26 IMAGING — CR DG CHEST 2V
2 series · 2 of 2 positions shown · non-contrast
Comparison: None.

CLINICAL DATA: Cough and sore throat

EXAM:
CHEST - 2 VIEW

[w chest pa]
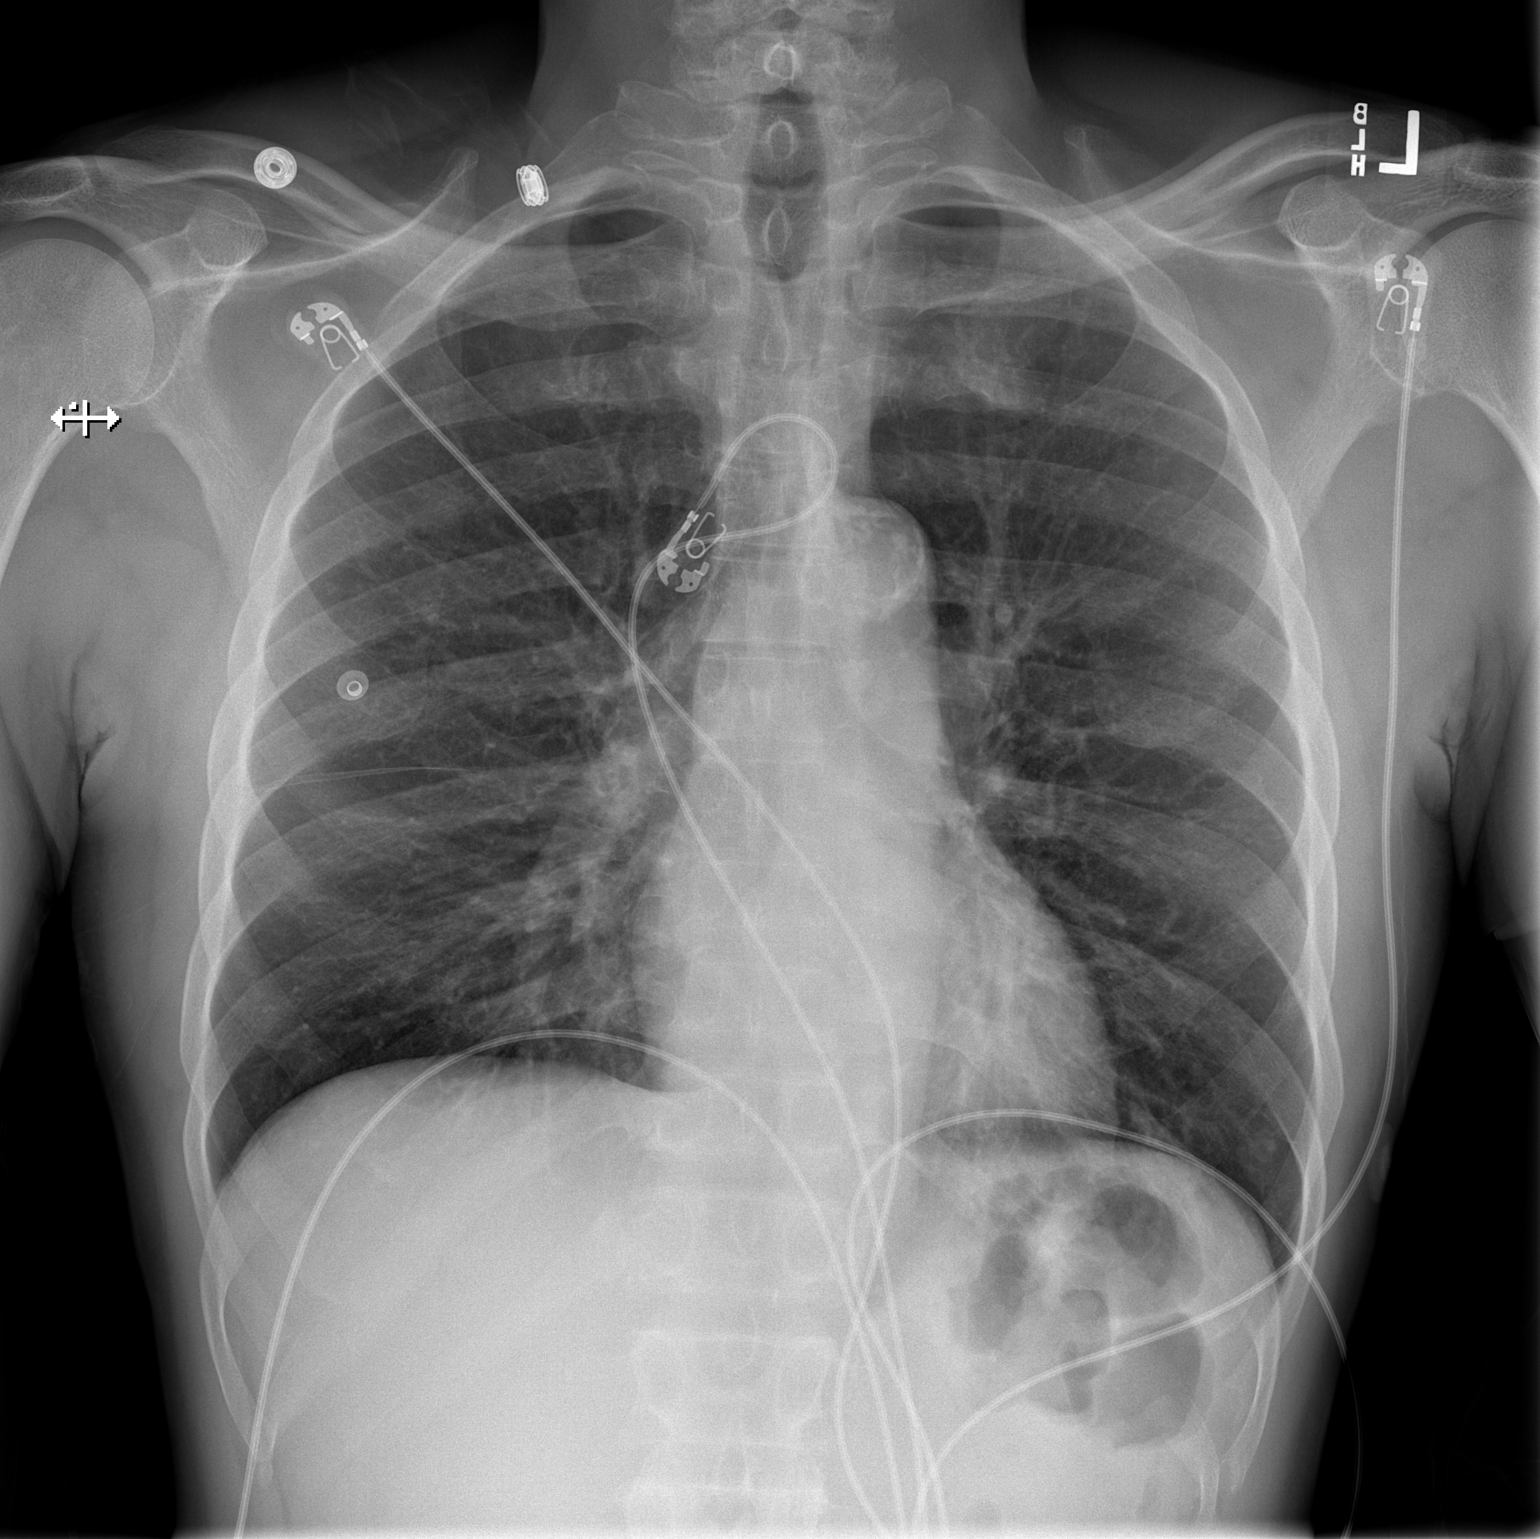

[w chest lat]
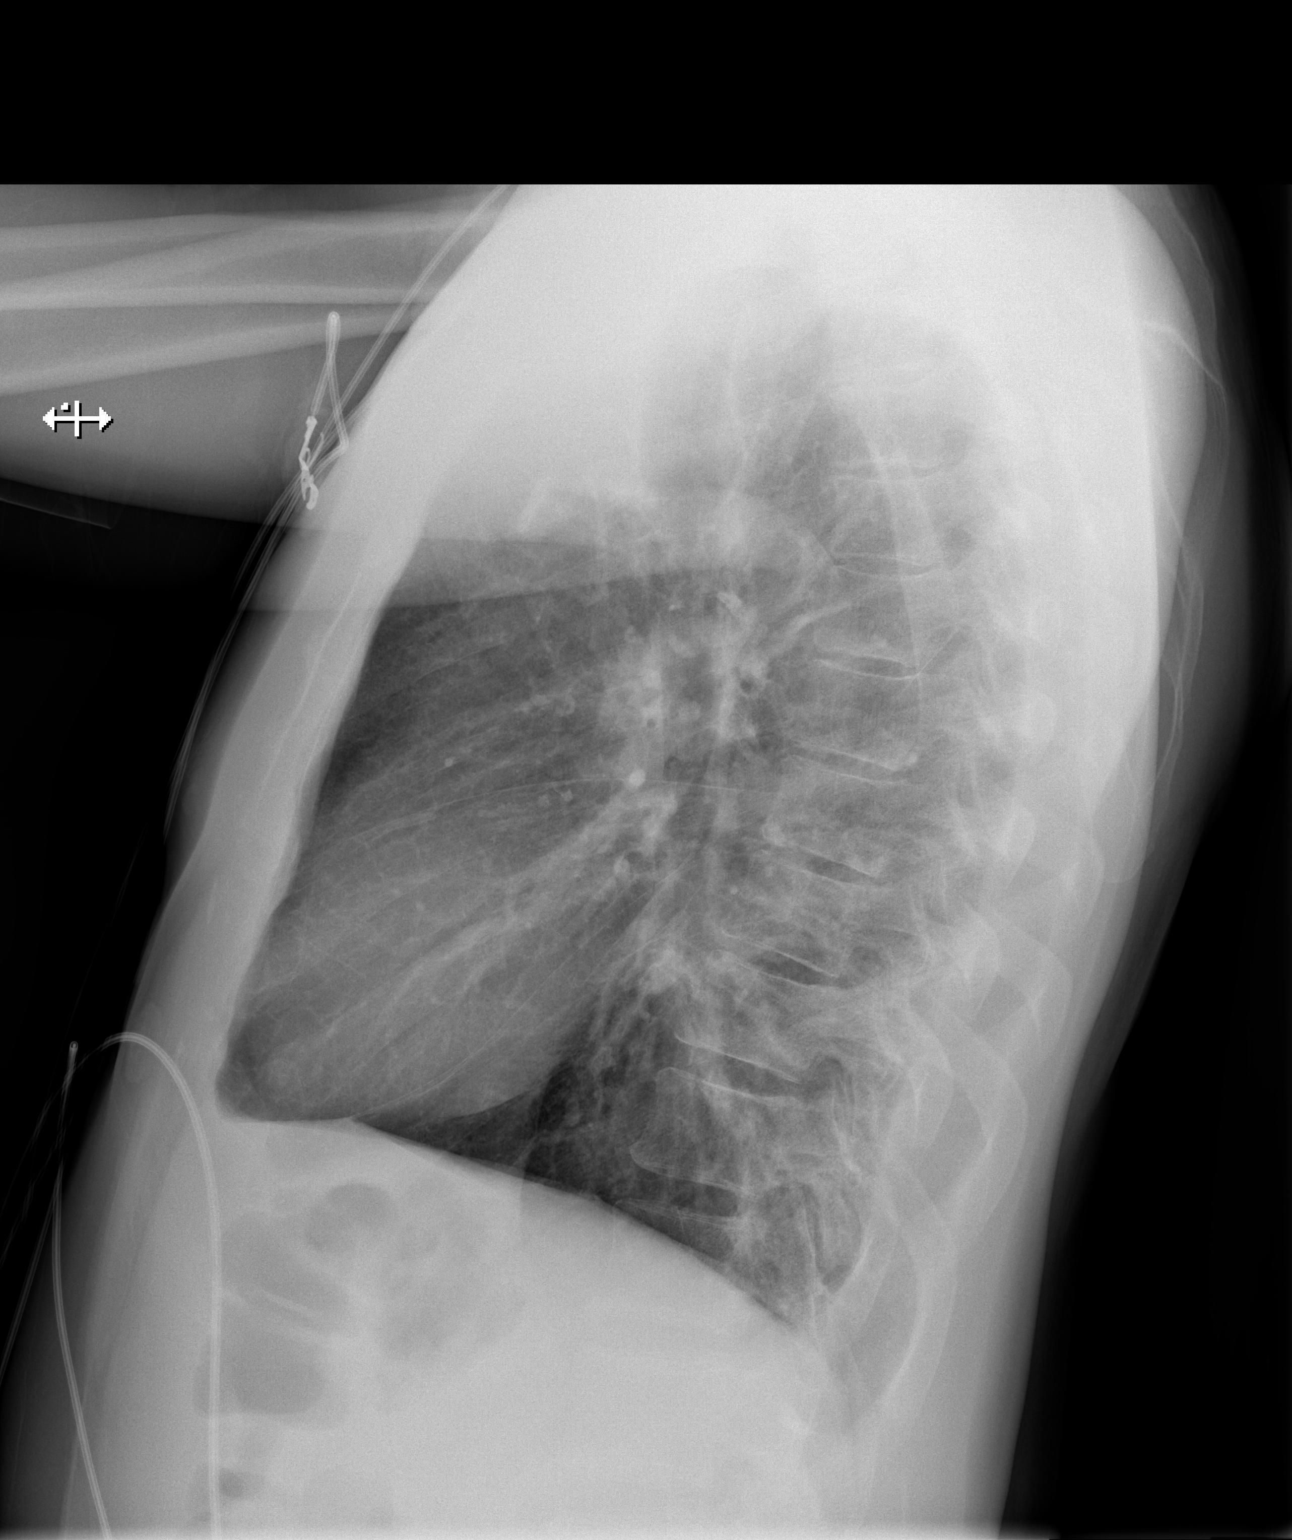

[2 of 2 positions shown; findings below may reference images not displayed]

FINDINGS: Lungs are clear. Heart size and pulmonary vascularity are normal. No
adenopathy. There is aortic atherosclerosis. No bone lesions.
IMPRESSION: Aortic atherosclerosis.  No edema or consolidation.

## 2020-08-03 ENCOUNTER — Ambulatory Visit: Payer: No Typology Code available for payment source | Admitting: Physician Assistant

## 2020-08-03 ENCOUNTER — Other Ambulatory Visit: Payer: Self-pay

## 2020-08-03 VITALS — BP 130/86 | HR 60 | Temp 97.0°F | Resp 18 | Ht 72.0 in | Wt 171.0 lb

## 2020-08-03 DIAGNOSIS — Z125 Encounter for screening for malignant neoplasm of prostate: Secondary | ICD-10-CM

## 2020-08-03 DIAGNOSIS — I10 Essential (primary) hypertension: Secondary | ICD-10-CM

## 2020-08-03 DIAGNOSIS — Z2821 Immunization not carried out because of patient refusal: Secondary | ICD-10-CM

## 2020-08-03 DIAGNOSIS — Z1159 Encounter for screening for other viral diseases: Secondary | ICD-10-CM

## 2020-08-03 DIAGNOSIS — Z0000 Encounter for general adult medical examination without abnormal findings: Secondary | ICD-10-CM

## 2020-08-03 DIAGNOSIS — B369 Superficial mycosis, unspecified: Secondary | ICD-10-CM

## 2020-08-03 DIAGNOSIS — Z13220 Encounter for screening for lipoid disorders: Secondary | ICD-10-CM

## 2020-08-03 DIAGNOSIS — Z1211 Encounter for screening for malignant neoplasm of colon: Secondary | ICD-10-CM

## 2020-08-03 DIAGNOSIS — Z720 Tobacco use: Secondary | ICD-10-CM

## 2020-08-03 MED ORDER — NYSTATIN-TRIAMCINOLONE 100000-0.1 UNIT/GM-% EX OINT: 1.0000 "application " | TOPICAL_OINTMENT | Freq: Two times a day (BID) | CUTANEOUS | 0 refills | Status: DC

## 2020-08-03 NOTE — Progress Notes (Signed)
New Patient Office Visit  Subjective:  Patient ID: Cameron Bates, male    DOB: [birth year]  Age: 64 y.o. MRN: [id number]  CC:  Chief Complaint  Patient presents with  . Rash    abdomen    HPI Cameron Bates reports that he has had a rash on his stomach for the last 2 to 3 months, states that it has been growing in size, describes it as itchy on and off, has tried alcohol and Vaseline with a lot of relief.  Denies any new fragrances, body washes, detergents, medications pets.  Reports that he was started on blood pressure medication approximately 4 years ago, states he was given hydrochlorothiazide 12.5 mg.  Reports that he has not taken this in the last 2 to 3 years.  Reports that he has increased his walking, has reduced the amount of cigarettes he smokes, is still smoking approximately 10 cigarettes a day.  Does not check blood pressure at home, denies any hypertensive symptoms, does not follow a low-sodium diet, endorses eating fast food often.   No other concerns at this time     Past Medical History:  Diagnosis Date  . Hypertension   . Tobacco abuse     History reviewed. No pertinent surgical history.  Family History  Problem Relation Age of Onset  . Hypertension Mother     Social History   Socioeconomic History  . Marital status: Married    Spouse name: Not on file  . Number of children: Not on file  . Years of education: Not on file  . Highest education level: Not on file  Occupational History  . Occupation: Photographer  Tobacco Use  . Smoking status: Current Every Day Smoker    Packs/day: 0.50    Years: 40.00    Pack years: 20.00    Types: Cigarettes  . Smokeless tobacco: Never Used  Vaping Use  . Vaping Use: Never used  Substance and Sexual Activity  . Alcohol use: Yes  . Drug use: No  . Sexual activity: Not Currently  Other Topics Concern  . Not on file  Social History Narrative  . Not on file   Social Determinants of Health   Financial  Resource Strain:   . Difficulty of Paying Living Expenses: Not on file  Food Insecurity:   . Worried About Programme researcher, broadcasting/film/video in the Last Year: Not on file  . Ran Out of Food in the Last Year: Not on file  Transportation Needs:   . Lack of Transportation (Medical): Not on file  . Lack of Transportation (Non-Medical): Not on file  Physical Activity:   . Days of Exercise per Week: Not on file  . Minutes of Exercise per Session: Not on file  Stress:   . Feeling of Stress : Not on file  Social Connections:   . Frequency of Communication with Friends and Family: Not on file  . Frequency of Social Gatherings with Friends and Family: Not on file  . Attends Religious Services: Not on file  . Active Member of Clubs or Organizations: Not on file  . Attends Banker Meetings: Not on file  . Marital Status: Not on file  Intimate Partner Violence:   . Fear of Current or Ex-Partner: Not on file  . Emotionally Abused: Not on file  . Physically Abused: Not on file  . Sexually Abused: Not on file    ROS Review of Systems  Constitutional: Negative.   HENT: Negative.  Eyes: Negative.   Respiratory: Negative.   Cardiovascular: Negative.   Gastrointestinal: Negative.   Endocrine: Negative for polyuria.  Genitourinary: Negative.   Musculoskeletal: Negative.   Skin: Positive for rash.  Allergic/Immunologic: Negative.   Neurological: Negative.   Hematological: Negative.   Psychiatric/Behavioral: Negative for dysphoric mood and sleep disturbance. The patient is not nervous/anxious.     Objective:   Today's Vitals: BP 130/86 (BP Location: Left Arm, Patient Position: Sitting, Cuff Size: Normal)   Pulse 60   Temp (!) 97 F (36.1 C) (Oral)   Resp 18   Ht 6' (1.829 m)   Wt 171 lb (77.6 kg)   SpO2 99%   BMI 23.19 kg/m   Physical Exam Vitals and nursing note reviewed.  Constitutional:      Appearance: Normal appearance.  HENT:     Head: Normocephalic and atraumatic.      Right Ear: External ear normal.     Left Ear: External ear normal.     Nose: Nose normal.     Mouth/Throat:     Mouth: Mucous membranes are moist.     Pharynx: Oropharynx is clear.  Eyes:     Extraocular Movements: Extraocular movements intact.     Conjunctiva/sclera: Conjunctivae normal.     Pupils: Pupils are equal, round, and reactive to light.  Cardiovascular:     Rate and Rhythm: Normal rate and regular rhythm.     Pulses: Normal pulses.     Heart sounds: Normal heart sounds.  Pulmonary:     Effort: Pulmonary effort is normal.     Breath sounds: Normal breath sounds.  Abdominal:     Palpations: Abdomen is soft.  Musculoskeletal:        General: Normal range of motion.     Cervical back: Normal range of motion and neck supple.  Skin:    Findings: Rash present.  Neurological:     General: No focal deficit present.     Mental Status: He is alert and oriented to person, place, and time.  Psychiatric:        Mood and Affect: Mood normal.        Behavior: Behavior normal.        Thought Content: Thought content normal.        Judgment: Judgment normal.   Rash:      Assessment & Plan:   Problem List Items Addressed This Visit      Cardiovascular and Mediastinum   Essential hypertension - Primary (Chronic)   Relevant Orders   CBC with Differential/Platelet   Comp. Metabolic Panel (12)   TSH     Other   Tobacco use (Chronic)    Other Visit Diagnoses    Special screening for malignant neoplasms, colon       Relevant Orders   Ambulatory referral to Gastroenterology   Wellness examination       Encounter for HCV screening test for low risk patient       Relevant Orders   HCV Ab w/Rflx to Verification   Screening for prostate cancer       Relevant Orders   PSA   Dermatitis fungal       Relevant Medications   nystatin-triamcinolone ointment (MYCOLOG)   Screening, lipid       Relevant Orders   Lipid panel   COVID-19 vaccination declined           Outpatient Encounter Medications as of 08/03/2020  Medication Sig  . Multiple Vitamin (MULTIVITAMIN WITH  MINERALS) TABS tablet Take 1 tablet by mouth daily.  Marland Kitchen aspirin 81 MG EC tablet Take 1 tablet (81 mg total) daily by mouth. Swallow whole. (Patient not taking: Reported on 11/24/2018)  . hydrochlorothiazide (MICROZIDE) 12.5 MG capsule Take 1 capsule (12.5 mg total) daily by mouth. (Patient not taking: Reported on 11/24/2018)  . nicotine (NICODERM CQ - DOSED IN MG/24 HR) 7 mg/24hr patch Place 1 patch (7 mg total) daily onto the skin. (Patient not taking: Reported on 11/24/2018)  . nicotine polacrilex (NICORETTE) 2 MG gum TAKE 1 EACH (2 MG TOTAL) AS NEEDED BY MOUTH FOR SMOKING CESSATION. (Patient not taking: Reported on 11/24/2018)  . nystatin-triamcinolone ointment (MYCOLOG) Apply 1 application topically 2 (two) times daily.  . [DISCONTINUED] oseltamivir (TAMIFLU) 75 MG capsule Take 1 capsule (75 mg total) by mouth every 12 (twelve) hours.  . [DISCONTINUED] promethazine (PHENERGAN) 25 MG tablet Take 1 tablet (25 mg total) by mouth every 6 (six) hours as needed for nausea or vomiting.   No facility-administered encounter medications on file as of 08/03/2020.  1. Essential hypertension Encourage patient to check blood pressure at home, keep written log, return to mobile unit for elevated readings, work on low-sodium diet, continue exercise regimen, work on smoking cessation.  Fasting labs completed today, patient given [facility]  - CBC with Differential/Platelet - Comp. Metabolic Panel (12) - TSH  2. Special screening for malignant neoplasms, colon  - Ambulatory referral to Gastroenterology  3. Wellness examination   4. Encounter for HCV screening test for low risk patient  - HCV Ab w/Rflx to Verification  5. Tobacco use Smoking cessation instruction/counseling given:  counseled patient on the  dangers of tobacco use, advised patient to stop smoking, and reviewed strategies to maximize success   6. Screening for prostate cancer  - PSA  7. Dermatitis fungal Patient education given - nystatin-triamcinolone ointment (MYCOLOG); Apply 1 application topically 2 (two) times daily.  Dispense: 60 g; Refill: 0  8. Screening, lipid Patient does have diagnosis of hyperlipidemia in  medical history, denies ever being treated with medication, last lab from 2018 lipid was within normal limits - Lipid panel  9. COVID-19 vaccination declined Patient education given   I have reviewed the patient's medical history (PMH, PSH, Social History, Family History, Medications, and allergies) , and have been updated if relevant. I spent 45 minutes reviewing chart and  face to face time with patient.      Follow-up: Return in about 7 weeks (around 09/24/2020) for [facility] with Dr. Delford Field .   Kasandra Knudsen Mayers, PA-C

## 2020-08-03 NOTE — Patient Instructions (Signed)
Please check your BP at home and keep a written log, please return to the mobile unit if your readings are elevated.(higher than 130/80)  I encourage you to work on a low sodium diet and keep up the physical activity.  I encourage you to work on quitting smoking .  I have ordered a referral for gastroenterology for a colonoscopy  We will call you with your lab results.  I sent a cream to your pharmacy for your rash, it should help with the itching as well, but you can use benadryl over the counter if the itching continues  Please let us know if there is anything else we can do for you  Roney Jaffe, PA-C Physician Assistant [facility] [email or web address]    DASH Eating Plan DASH stands for "Dietary Approaches to Stop Hypertension." The DASH eating plan is a healthy eating plan that has been shown to reduce high blood pressure (hypertension). It may also reduce your risk for type 2 diabetes, heart disease, and stroke. The DASH eating plan may also help with weight loss. What are tips for following this plan?  General guidelines  Avoid eating more than 2,300 mg (milligrams) of salt (sodium) a day. If you have hypertension, you may need to reduce your sodium intake to 1,500 mg a day.  Limit alcohol intake to no more than 1 drink a day for nonpregnant women and 2 drinks a day for men. One drink equals 12 oz of beer, 5 oz of wine, or 1 oz of hard liquor.  Work with your health care provider to maintain a healthy body weight or to lose weight. Ask what an ideal weight is for you.  Get at least 30 minutes of exercise that causes your heart to beat faster (aerobic exercise) most days of the week. Activities may include walking, swimming, or biking.  Work with your health care provider or diet and nutrition specialist (dietitian) to adjust your eating plan to your individual calorie needs. Reading food labels   Check food labels for  the amount of sodium per serving. Choose foods with less than 5 percent of the Daily Value of sodium. Generally, foods with less than 300 mg of sodium per serving fit into this eating plan.  To find whole grains, look for the word "whole" as the first word in the ingredient list. Shopping  Buy products labeled as "low-sodium" or "no salt added."  Buy fresh foods. Avoid canned foods and premade or frozen meals. Cooking  Avoid adding salt when cooking. Use salt-free seasonings or herbs instead of table salt or sea salt. Check with your health care provider or pharmacist before using salt substitutes.  Do not fry foods. Cook foods using healthy methods such as baking, boiling, grilling, and broiling instead.  Cook with heart-healthy oils, such as olive, canola, soybean, or sunflower oil. Meal planning  Eat a balanced diet that includes: ? 5 or more servings of fruits and vegetables each day. At each meal, try to fill half of your plate with fruits and vegetables. ? Up to 6-8 servings of whole grains each day. ? Less than 6 oz of lean meat, poultry, or fish each day. A 3-oz serving of meat is about the same size as a deck of cards. One egg equals 1 oz. ? 2 servings of low-fat dairy each day. ? A serving of nuts, seeds, or beans 5 times each week. ? Heart-healthy fats. Healthy fats called Omega-3 fatty acids are found in foods  such as flaxseeds and coldwater fish, like sardines, salmon, and mackerel.  Limit how much you eat of the following: ? Canned or prepackaged foods. ? Food that is high in trans fat, such as fried foods. ? Food that is high in saturated fat, such as fatty meat. ? Sweets, desserts, sugary drinks, and other foods with added sugar. ? Full-fat dairy products.  Do not salt foods before eating.  Try to eat at least 2 vegetarian meals each week.  Eat more home-cooked food and less restaurant, buffet, and fast food.  When eating at a restaurant, ask that your food be  prepared with less salt or no salt, if possible. What foods are recommended? The items listed may not be a complete list. Talk with your dietitian about what dietary choices are best for you. Grains Whole-grain or whole-wheat bread. Whole-grain or whole-wheat pasta. Brown rice. [place]row. Bulgur. Whole-grain and low-sodium cereals. Pita bread. Low-fat, low-sodium crackers. Whole-wheat flour tortillas. Vegetables Fresh or frozen vegetables (raw, steamed, roasted, or grilled). Low-sodium or reduced-sodium tomato and vegetable juice. Low-sodium or reduced-sodium tomato sauce and tomato paste. Low-sodium or reduced-sodium canned vegetables. Fruits All fresh, dried, or frozen fruit. Canned fruit in natural juice (without added sugar). Meat and other protein foods Skinless chicken or [place]. Ground chicken or [place]. Pork with fat trimmed off. Fish and seafood. Egg whites. Dried beans, peas, or lentils. Unsalted nuts, nut butters, and seeds. Unsalted canned beans. Lean cuts of beef with fat trimmed off. Low-sodium, lean deli meat. Dairy Low-fat (1%) or fat-free (skim) milk. Fat-free, low-fat, or reduced-fat cheeses. Nonfat, low-sodium ricotta or cottage cheese. Low-fat or nonfat yogurt. Low-fat, low-sodium cheese. Fats and oils Soft margarine without trans fats. Vegetable oil. Low-fat, reduced-fat, or light mayonnaise and salad dressings (reduced-sodium). Canola, safflower, olive, soybean, and sunflower oils. Avocado. Seasoning and other foods Herbs. Spices. Seasoning mixes without salt. Unsalted popcorn and pretzels. Fat-free sweets. What foods are not recommended? The items listed may not be a complete list. Talk with your dietitian about what dietary choices are best for you. Grains Baked goods made with fat, such as croissants, muffins, or some breads. Dry pasta or rice meal packs. Vegetables Creamed or fried vegetables. Vegetables in a cheese sauce. Regular canned vegetables (not  low-sodium or reduced-sodium). Regular canned tomato sauce and paste (not low-sodium or reduced-sodium). Regular tomato and vegetable juice (not low-sodium or reduced-sodium). Angie Fava. Olives. Fruits Canned fruit in a light or heavy syrup. Fried fruit. Fruit in cream or butter sauce. Meat and other protein foods Fatty cuts of meat. Ribs. Fried meat. Berniece Salines. Sausage. Bologna and other processed lunch meats. Salami. Fatback. Hotdogs. Bratwurst. Salted nuts and seeds. Canned beans with added salt. Canned or smoked fish. Whole eggs or egg yolks. Chicken or [place] with skin. Dairy Whole or 2% milk, cream, and half-and-half. Whole or full-fat cream cheese. Whole-fat or sweetened yogurt. Full-fat cheese. Nondairy creamers. Whipped toppings. Processed cheese and cheese spreads. Fats and oils Butter. Stick margarine. Lard. Shortening. Ghee. Bacon fat. Tropical oils, such as coconut, palm kernel, or palm oil. Seasoning and other foods Salted popcorn and pretzels. Onion salt, garlic salt, seasoned salt, table salt, and sea salt. Worcestershire sauce. Tartar sauce. Barbecue sauce. Teriyaki sauce. Soy sauce, including reduced-sodium. Steak sauce. Canned and packaged gravies. Fish sauce. Oyster sauce. Cocktail sauce. Horseradish that you find on the shelf. Ketchup. Mustard. Meat flavorings and tenderizers. Bouillon cubes. Hot sauce and Tabasco sauce. Premade or packaged marinades. Premade or packaged taco seasonings. Relishes. Regular salad  dressings. Where to find more information:  National Heart, Lung, and Blood Institute: PopSteam.iswww.nhlbi.nih.gov  American Heart Association: www.heart.org Summary  The DASH eating plan is a healthy eating plan that has been shown to reduce high blood pressure (hypertension). It may also reduce your risk for type 2 diabetes, heart disease, and stroke.  With the DASH eating plan, you should limit salt (sodium) intake to 2,300 mg a day. If you have hypertension, you may need to reduce  your sodium intake to 1,500 mg a day.  When on the DASH eating plan, aim to eat more fresh fruits and vegetables, whole grains, lean proteins, low-fat dairy, and heart-healthy fats.  Work with your health care provider or diet and nutrition specialist (dietitian) to adjust your eating plan to your individual calorie needs. This information is not intended to replace advice given to you by your health care provider. Make sure you discuss any questions you have with your health care provider. Document Revised: 09/15/2017 Document Reviewed: 09/26/2016 Elsevier Patient Education  2020 Elsevier Inc.   Atopic Dermatitis Atopic dermatitis is a skin disorder that causes inflammation of the skin. This is the most common type of eczema. Eczema is a group of skin conditions that cause the skin to be itchy, red, and swollen. This condition is generally worse during the cooler winter months and often improves during the warm summer months. Symptoms can vary from person to person. Atopic dermatitis usually starts showing signs in infancy and can last through adulthood. This condition cannot be passed from one person to another (non-contagious), but it is more common in families. Atopic dermatitis may not always be present. When it is present, it is called a flare-up. What are the causes? The exact cause of this condition is not known. Flare-ups of the condition may be triggered by:  Contact with something that you are sensitive or allergic to.  Stress.  Certain foods.  Extremely hot or cold weather.  Harsh chemicals and soaps.  Dry air.  Chlorine. What increases the risk? This condition is more likely to develop in people who have a personal history or family history of eczema, allergies, asthma, or hay fever. What are the signs or symptoms? Symptoms of this condition include:  Dry, scaly skin.  Red, itchy rash.  Itchiness, which can be severe. This may occur before the skin rash. This can  make sleeping difficult.  Skin thickening and cracking that can occur over time. How is this diagnosed? This condition is diagnosed based on your symptoms, a medical history, and a physical exam. How is this treated? There is no cure for this condition, but symptoms can usually be controlled. Treatment focuses on:  Controlling the itchiness and scratching. You may be given medicines, such as antihistamines or steroid creams.  Limiting exposure to things that you are sensitive or allergic to (allergens).  Recognizing situations that cause stress and developing a plan to manage stress. If your atopic dermatitis does not get better with medicines, or if it is all over your body (widespread), a treatment using a specific type of light (phototherapy) may be used. Follow these instructions at home: Skin care   Keep your skin well-moisturized. Doing this seals in moisture and helps to prevent dryness. ? Use unscented lotions that have petroleum in them. ? Avoid lotions that contain alcohol or water. They can dry the skin.  Keep baths or showers short (less than 5 minutes) in warm water. Do not use hot water. ? Use mild, unscented  cleansers for bathing. Avoid soap and bubble bath. ? Apply a moisturizer to your skin right after a bath or shower.  Do not apply anything to your skin without checking with your health care provider. General instructions  Dress in clothes made of cotton or cotton blends. Dress lightly because heat increases itchiness.  When washing your clothes, rinse your clothes twice so all of the soap is removed.  Avoid any triggers that can cause a flare-up.  Try to manage your stress.  Keep your fingernails cut short.  Avoid scratching. Scratching makes the rash and itchiness worse. It may also result in a skin infection (impetigo) due to a break in the skin caused by scratching.  Take or apply over-the-counter and prescription medicines only as told by your health  care provider.  Keep all follow-up visits as told by your health care provider. This is important.  Do not be around people who have cold sores or fever blisters. If you get the infection, it may cause your atopic dermatitis to worsen. Contact a health care provider if:  Your itchiness interferes with sleep.  Your rash gets worse or it is not better within one week of starting treatment.  You have a fever.  You have a rash flare-up after having contact with someone who has cold sores or fever blisters. Get help right away if:  You develop pus or soft yellow scabs in the rash area. Summary  This condition causes a red rash and itchy, dry, scaly skin.  Treatment focuses on controlling the itchiness and scratching, limiting exposure to things that you are sensitive or allergic to (allergens), recognizing situations that cause stress, and developing a plan to manage stress.  Keep your skin well-moisturized.  Keep baths or showers shorter than 5 minutes and use warm water. Do not use hot water. This information is not intended to replace advice given to you by your health care provider. Make sure you discuss any questions you have with your health care provider. Document Revised: 01/22/2019 Document Reviewed: 11/04/2016 Elsevier Patient Education  2020 [employer].   How to Take Your Blood Pressure You can take your blood pressure at home with a machine. You may need to check your blood pressure at home:  To check if you have high blood pressure (hypertension).  To check your blood pressure over time.  To make sure your blood pressure medicine is working. Supplies needed: You will need a blood pressure machine, or monitor. You can buy one at a drugstore or online. When choosing one:  Choose one with an arm cuff.  Choose one that wraps around your upper arm. Only one finger should fit between your arm and the cuff.  Do not choose one that measures your blood pressure from  your wrist or finger. Your doctor can suggest a monitor. How to prepare Avoid these things for 30 minutes before checking your blood pressure:  Drinking caffeine.  Drinking alcohol.  Eating.  Smoking.  Exercising. Five minutes before checking your blood pressure:  Pee.  Sit in a dining chair. Avoid sitting in a soft couch or armchair.  Be quiet. Do not talk. How to take your blood pressure Follow the instructions that came with your machine. If you have a digital blood pressure monitor, these may be the instructions: 1. Sit up straight. 2. Place your feet on the floor. Do not cross your ankles or legs. 3. Rest your left arm at the level of your heart. You may rest  it on a table, desk, or chair. 4. Pull up your shirt sleeve. 5. Wrap the blood pressure cuff around the upper part of your left arm. The cuff should be 1 inch (2.5 cm) above your elbow. It is best to wrap the cuff around bare skin. 6. Fit the cuff snugly around your arm. You should be able to place only one finger between the cuff and your arm. 7. Put the cord inside the groove of your elbow. 8. Press the power button. 9. Sit quietly while the cuff fills with air and loses air. 10. Write down the numbers on the screen. 11. Wait 2-3 minutes and then repeat steps 1-10. What do the numbers mean? Two numbers make up your blood pressure. The first number is called systolic pressure. The second is called diastolic pressure. An example of a blood pressure reading is "120 over 80" (or 120/80). If you are an adult and do not have a medical condition, use this guide to find out if your blood pressure is normal: Normal  First number: below 120.  Second number: below 80. Elevated  First number: 120-129.  Second number: below 80. Hypertension stage 1  First number: 130-139.  Second number: 80-89. Hypertension stage 2  First number: 140 or above.  Second number: 90 or above. Your blood pressure is above normal  even if only the top or bottom number is above normal. Follow these instructions at home:  Check your blood pressure as often as your doctor tells you to.  Take your monitor to your next doctor's appointment. Your doctor will: ? Make sure you are using it correctly. ? Make sure it is working right.  Make sure you understand what your blood pressure numbers should be.  Tell your doctor if your medicines are causing side effects. Contact a doctor if:  Your blood pressure keeps being high. Get help right away if:  Your first blood pressure number is higher than 180.  Your second blood pressure number is higher than 120. This information is not intended to replace advice given to you by your health care provider. Make sure you discuss any questions you have with your health care provider. Document Revised: 09/15/2017 Document Reviewed: 03/11/2016 Elsevier Patient Education  2020 [employer].

## 2020-08-04 LAB — CBC WITH DIFFERENTIAL/PLATELET
Basophils Absolute: 0.1 10*3/uL (ref 0.0–0.2)
Basos: 1 %
EOS (ABSOLUTE): 0.1 10*3/uL (ref 0.0–0.4)
Eos: 2 %
Hematocrit: 48.2 % (ref 37.5–51.0)
Hemoglobin: 16.6 g/dL (ref 13.0–17.7)
Immature Grans (Abs): 0 10*3/uL (ref 0.0–0.1)
Immature Granulocytes: 0 %
Lymphocytes Absolute: 1.8 10*3/uL (ref 0.7–3.1)
Lymphs: 23 %
MCH: 30.5 pg (ref 26.6–33.0)
MCHC: 34.4 g/dL (ref 31.5–35.7)
MCV: 89 fL (ref 79–97)
Monocytes Absolute: 0.4 10*3/uL (ref 0.1–0.9)
Monocytes: 5 %
Neutrophils Absolute: 5.3 10*3/uL (ref 1.4–7.0)
Neutrophils: 69 %
Platelets: 237 10*3/uL (ref 150–450)
RBC: 5.44 x10E6/uL (ref 4.14–5.80)
RDW: 13.6 % (ref 11.6–15.4)
WBC: 7.5 10*3/uL (ref 3.4–10.8)

## 2020-08-04 LAB — COMP. METABOLIC PANEL (12)
AST: 29 IU/L (ref 0–40)
Albumin/Globulin Ratio: 1.6 (ref 1.2–2.2)
Albumin: 4.9 g/dL — ABNORMAL HIGH (ref 3.8–4.8)
Alkaline Phosphatase: 86 IU/L (ref 44–121)
BUN/Creatinine Ratio: 11 (ref 10–24)
BUN: 10 mg/dL (ref 8–27)
Bilirubin Total: 0.7 mg/dL (ref 0.0–1.2)
Calcium: 10.3 mg/dL — ABNORMAL HIGH (ref 8.6–10.2)
Chloride: 104 mmol/L (ref 96–106)
Creatinine, Ser: 0.88 mg/dL (ref 0.76–1.27)
GFR calc Af Amer: 105 mL/min/{1.73_m2} (ref >59)
GFR calc non Af Amer: 91 mL/min/{1.73_m2} (ref >59)
Globulin, Total: 3.1 g/dL (ref 1.5–4.5)
Glucose: 93 mg/dL (ref 65–99)
Potassium: 3.9 mmol/L (ref 3.5–5.2)
Sodium: 142 mmol/L (ref 134–144)
Total Protein: 8 g/dL (ref 6.0–8.5)

## 2020-08-04 LAB — TSH: TSH: 2.05 u[IU]/mL (ref 0.450–4.500)

## 2020-08-04 LAB — LIPID PANEL
Chol/HDL Ratio: 2.7 ratio (ref 0.0–5.0)
Cholesterol, Total: 178 mg/dL (ref 100–199)
HDL: 66 mg/dL (ref >39)
LDL Chol Calc (NIH): 100 mg/dL — ABNORMAL HIGH (ref 0–99)
Triglycerides: 61 mg/dL (ref 0–149)
VLDL Cholesterol Cal: 12 mg/dL (ref 5–40)

## 2020-08-04 LAB — PSA: Prostate Specific Ag, Serum: 3.5 ng/mL (ref 0.0–4.0)

## 2020-08-04 LAB — HCV AB W/RFLX TO VERIFICATION: HCV Ab: 0.1 s/co ratio (ref 0.0–0.9)

## 2020-08-04 LAB — HCV INTERPRETATION

## 2020-08-05 ENCOUNTER — Telehealth: Payer: Self-pay | Admitting: *Deleted

## 2020-08-05 NOTE — Telephone Encounter (Signed)
-----   Message from Roney Jaffe, [place] sent at 08/05/2020  4:09 PM EDT ----- Please call patient and ask to come in to discuss lab results - thanks!

## 2020-08-06 ENCOUNTER — Telehealth: Payer: No Typology Code available for payment source | Admitting: Physician Assistant

## 2020-08-06 DIAGNOSIS — I2510 Atherosclerotic heart disease of native coronary artery without angina pectoris: Secondary | ICD-10-CM

## 2020-08-06 DIAGNOSIS — E7800 Pure hypercholesterolemia, unspecified: Secondary | ICD-10-CM

## 2020-08-06 MED ORDER — ATORVASTATIN CALCIUM 10 MG PO TABS: 10.0000 mg | ORAL_TABLET | Freq: Every day | ORAL | 2 refills | Status: DC

## 2020-08-06 NOTE — Progress Notes (Signed)
Established Patient Office Visit  Subjective:  Patient ID: Cameron Bates, male    DOB: 23-Aug-1956  Age: 64 y.o. MRN: [id number]  CC:  Chief Complaint  Patient presents with  . Results    Virtual Visit via Video Note  I connected with Dorise Hiss on 08/06/20 at 10:00 AM EDT by a video enabled telemedicine application and verified that I am speaking with the correct person using two identifiers.  Location: Patient: Work Provider: [facility]   I discussed the limitations of evaluation and management by telemedicine and the availability of in person appointments. The patient expressed understanding and agreed to proceed.  History of Present Illness: Reports that he has not been treated for hyperlipidemia in the past, has never taken a statin before.  No new concerns today.   Observations/Objective:  Medical history and current medications reviewed, no physical exam completed    Past Medical History:  Diagnosis Date  . Hypertension   . Tobacco abuse     History reviewed. No pertinent surgical history.  Family History  Problem Relation Age of Onset  . Hypertension Mother     Social History   Socioeconomic History  . Marital status: Married    Spouse name: Not on file  . Number of children: Not on file  . Years of education: Not on file  . Highest education level: Not on file  Occupational History  . Occupation: Photographer  Tobacco Use  . Smoking status: Current Every Day Smoker    Packs/day: 0.50    Years: 40.00    Pack years: 20.00    Types: Cigarettes  . Smokeless tobacco: Never Used  Vaping Use  . Vaping Use: Never used  Substance and Sexual Activity  . Alcohol use: Yes  . Drug use: No  . Sexual activity: Not Currently  Other Topics Concern  . Not on file  Social History Narrative  . Not on file   Social Determinants of Health   Financial Resource Strain:   . Difficulty of Paying Living Expenses: Not on file  Food  Insecurity:   . Worried About Programme researcher, broadcasting/film/video in the Last Year: Not on file  . Ran Out of Food in the Last Year: Not on file  Transportation Needs:   . Lack of Transportation (Medical): Not on file  . Lack of Transportation (Non-Medical): Not on file  Physical Activity:   . Days of Exercise per Week: Not on file  . Minutes of Exercise per Session: Not on file  Stress:   . Feeling of Stress : Not on file  Social Connections:   . Frequency of Communication with Friends and Family: Not on file  . Frequency of Social Gatherings with Friends and Family: Not on file  . Attends Religious Services: Not on file  . Active Member of Clubs or Organizations: Not on file  . Attends Banker Meetings: Not on file  . Marital Status: Not on file  Intimate Partner Violence:   . Fear of Current or Ex-Partner: Not on file  . Emotionally Abused: Not on file  . Physically Abused: Not on file  . Sexually Abused: Not on file    Outpatient Medications Prior to Visit  Medication Sig Dispense Refill  . aspirin 81 MG EC tablet Take 1 tablet (81 mg total) daily by mouth. Swallow whole. (Patient not taking: Reported on 11/24/2018) 90 tablet 2  . hydrochlorothiazide (MICROZIDE) 12.5 MG capsule Take 1 capsule (12.5  mg total) daily by mouth. (Patient not taking: Reported on 11/24/2018) 90 capsule 2  . Multiple Vitamin (MULTIVITAMIN WITH MINERALS) TABS tablet Take 1 tablet by mouth daily. 60 tablet 0  . nicotine (NICODERM CQ - DOSED IN MG/24 HR) 7 mg/24hr patch Place 1 patch (7 mg total) daily onto the skin. (Patient not taking: Reported on 11/24/2018) 28 patch 0  . nicotine polacrilex (NICORETTE) 2 MG gum TAKE 1 EACH (2 MG TOTAL) AS NEEDED BY MOUTH FOR SMOKING CESSATION. (Patient not taking: Reported on 11/24/2018) 100 each 0  . nystatin-triamcinolone ointment (MYCOLOG) Apply 1 application topically 2 (two) times daily. 60 g 0   No facility-administered medications prior to visit.    No Known  Allergies  ROS Review of Systems  Constitutional: Negative.   HENT: Negative.   Eyes: Negative.   Respiratory: Negative.   Cardiovascular: Negative.   Gastrointestinal: Negative.   Endocrine: Negative.   Genitourinary: Negative.   Musculoskeletal: Negative.   Allergic/Immunologic: Negative.   Neurological: Negative.   Hematological: Negative.   Psychiatric/Behavioral: Negative.       Objective:     There were no vitals taken for this visit. Wt Readings from Last 3 Encounters:  08/03/20 171 lb (77.6 kg)  11/24/18 163 lb (73.9 kg)  08/25/17 163 lb 12.8 oz (74.3 kg)     Health Maintenance Due  Topic Date Due  . COLONOSCOPY  Never done    There are no preventive care reminders to display for this patient.  Lab Results  Component Value Date   TSH 2.050 08/03/2020   Lab Results  Component Value Date   WBC 7.5 08/03/2020   HGB 16.6 08/03/2020   HCT 48.2 08/03/2020   MCV 89 08/03/2020   PLT 237 08/03/2020   Lab Results  Component Value Date   NA 142 08/03/2020   K 3.9 08/03/2020   CO2 22 11/25/2018   GLUCOSE 93 08/03/2020   BUN 10 08/03/2020   CREATININE 0.88 08/03/2020   BILITOT 0.7 08/03/2020   ALKPHOS 86 08/03/2020   AST 29 08/03/2020   ALT 25 11/25/2018   PROT 8.0 08/03/2020   ALBUMIN 4.9 (H) 08/03/2020   CALCIUM 10.3 (H) 08/03/2020   ANIONGAP 13 11/25/2018   Lab Results  Component Value Date   CHOL 178 08/03/2020   Lab Results  Component Value Date   HDL 66 08/03/2020   Lab Results  Component Value Date   LDLCALC 100 (H) 08/03/2020   Lab Results  Component Value Date   TRIG 61 08/03/2020   Lab Results  Component Value Date   CHOLHDL 2.7 08/03/2020   Lab Results  Component Value Date   HGBA1C 5.5 11/25/2016      Assessment & Plan:   Problem List Items Addressed This Visit      Cardiovascular and Mediastinum   Cardiovascular disease - Primary   Relevant Medications   atorvastatin (LIPITOR) 10 MG tablet     Other    Elevated LDL cholesterol level   Relevant Medications   atorvastatin (LIPITOR) 10 MG tablet     Assessment and Plan: The 10-year ASCVD risk score Denman George DC Jr., et al., 2013) is: 23.4%   Values used to calculate the score:     Age: 9 years     Sex: Male     Is Non-Hispanic African American: Yes     Diabetic: No     Tobacco smoker: Yes     Systolic Blood Pressure: 130 mmHg  Is BP treated: Yes     HDL Cholesterol: 66 mg/dL     Total Cholesterol: 178 mg/dL  Patient agreeable to low dose statin.  Continue working on smoking cessation.  Patient education given regarding ASCVD risk score  Follow Up Instructions:    I discussed the assessment and treatment plan with the patient. The patient was provided an opportunity to ask questions and all were answered. The patient agreed with the plan and demonstrated an understanding of the instructions.   The patient was advised to call back or seek an in-person evaluation if the symptoms worsen or if the condition fails to improve as anticipated.  I provided 21  minutes of non-face-to-face time during this encounter.   Preslie Depasquale S Mayers, PA-C   Meds ordered this encounter  Medications  . atorvastatin (LIPITOR) 10 MG tablet    Sig: Take 1 tablet (10 mg total) by mouth daily.    Dispense:  30 tablet    Refill:  2    Order Specific Question:   Supervising Provider    Answer:   Storm Frisk [[id number]]    Follow-up: Return if symptoms worsen or fail to improve.    Kasandra Knudsen Mayers, PA-C

## 2020-08-06 NOTE — Progress Notes (Signed)
Patient had a tele-visit to discuss risk factors from lab results.

## 2020-08-06 NOTE — Progress Notes (Signed)
Patient verified DOB Patient spoke with provider regarding risk factors.

## 2020-08-06 NOTE — Patient Instructions (Signed)
Atorvastatin tablets What is this medicine? ATORVASTATIN (a TORE va sta tin) is known as a HMG-CoA reductase inhibitor or 'statin'. It lowers the level of cholesterol and triglycerides in the blood. This drug may also reduce the risk of heart attack, stroke, or other health problems in patients with risk factors for heart disease. Diet and lifestyle changes are often used with this drug. This medicine may be used for other purposes; ask your health care provider or pharmacist if you have questions. COMMON BRAND NAME(S): Lipitor What should I tell my health care provider before I take this medicine? They need to know if you have any of these conditions:  diabetes  if you often drink alcohol  history of stroke  kidney disease  liver disease  muscle aches or weakness  thyroid disease  an unusual or allergic reaction to atorvastatin, other medicines, foods, dyes, or preservatives  pregnant or trying to get pregnant  breast-feeding How should I use this medicine? Take this medicine by mouth with a glass of water. Follow the directions on the prescription label. You can take it with or without food. If it upsets your stomach, take it with food. Do not take with grapefruit juice. Take your medicine at regular intervals. Do not take it more often than directed. Do not stop taking except on your doctor's advice. Talk to your pediatrician regarding the use of this medicine in children. While this drug may be prescribed for children as young as 10 for selected conditions, precautions do apply. Overdosage: If you think you have taken too much of this medicine contact a poison control center or emergency room at once. NOTE: This medicine is only for you. Do not share this medicine with others. What if I miss a dose? If you miss a dose, take it as soon as you can. If your next dose is to be taken in less than 12 hours, then do not take the missed dose. Take the next dose at your regular time. Do  not take double or extra doses. What may interact with this medicine? Do not take this medicine with any of the following medications:  dasabuvir; ombitasvir; paritaprevir; ritonavir  ombitasvir; paritaprevir; ritonavir  posaconazole  red yeast rice This medicine may also interact with the following medications:  alcohol  birth control pills  certain antibiotics like erythromycin and clarithromycin  certain antivirals for HIV or hepatitis  certain medicines for cholesterol like fenofibrate, gemfibrozil, and niacin  certain medicines for fungal infections like ketoconazole and itraconazole  colchicine  cyclosporine  digoxin  grapefruit juice  rifampin This list may not describe all possible interactions. Give your health care provider a list of all the medicines, herbs, non-prescription drugs, or dietary supplements you use. Also tell them if you smoke, drink alcohol, or use illegal drugs. Some items may interact with your medicine. What should I watch for while using this medicine? Visit your doctor or health care professional for regular check-ups. You may need regular tests to make sure your liver is working properly. Your health care professional may tell you to stop taking this medicine if you develop muscle problems. If your muscle problems do not go away after stopping this medicine, contact your health care professional. Do not become pregnant while taking this medicine. Women should inform their health care professional if they wish to become pregnant or think they might be pregnant. There is a potential for serious side effects to an unborn child. Talk to your health care professional or   pharmacist for more information. Do not breast-feed an infant while taking this medicine. This medicine may increase blood sugar. Ask your healthcare provider if changes in diet or medicines are needed if you have diabetes. If you are going to need surgery or other procedure, tell  your doctor that you are using this medicine. This drug is only part of a total heart-health program. Your doctor or a dietician can suggest a low-cholesterol and low-fat diet to help. Avoid alcohol and smoking, and keep a proper exercise schedule. This medicine may cause a decrease in Co-Enzyme Q-10. You should make sure that you get enough Co-Enzyme Q-10 while you are taking this medicine. Discuss the foods you eat and the vitamins you take with your health care professional. What side effects may I notice from receiving this medicine? Side effects that you should report to your doctor or health care professional as soon as possible:  allergic reactions like skin rash, itching or hives, swelling of the face, lips, or tongue  fever  joint pain  loss of memory  redness, blistering, peeling or loosening of the skin, including inside the mouth  signs and symptoms of high blood sugar such as being more thirsty or hungry or having to urinate more than normal. You may also feel very tired or have blurry vision.  signs and symptoms of liver injury like dark yellow or brown urine; general ill feeling or flu-like symptoms; light-belly pain; unusually weak or tired; yellowing of the eyes or skin  signs and symptoms of muscle injury like dark urine; trouble passing urine or change in the amount of urine; unusually weak or tired; muscle pain or side or back pain Side effects that usually do not require medical attention (report to your doctor or health care professional if they continue or are bothersome):  diarrhea  nausea  stomach pain  trouble sleeping  upset stomach This list may not describe all possible side effects. Call your doctor for medical advice about side effects. You may report side effects to FDA at 1-800-FDA-1088. Where should I keep my medicine? Keep out of the reach of children. Store between 20 and 25 degrees C (68 and 77 degrees F). Throw away any unused medicine after  the expiration date. NOTE: This sheet is a summary. It may not cover all possible information. If you have questions about this medicine, talk to your doctor, pharmacist, or health care provider.  2020 [employer]vier/Gold Standard (2018-07-25 11:36:16)  

## 2020-09-24 ENCOUNTER — Other Ambulatory Visit: Payer: Self-pay

## 2020-09-24 ENCOUNTER — Encounter: Payer: Self-pay | Admitting: Critical Care Medicine

## 2020-09-24 ENCOUNTER — Ambulatory Visit
Payer: No Typology Code available for payment source | Attending: Critical Care Medicine | Admitting: Critical Care Medicine

## 2020-09-24 VITALS — BP 135/86 | HR 95 | Ht 71.89 in | Wt 213.0 lb

## 2020-09-24 DIAGNOSIS — I2510 Atherosclerotic heart disease of native coronary artery without angina pectoris: Secondary | ICD-10-CM

## 2020-09-24 DIAGNOSIS — Z720 Tobacco use: Secondary | ICD-10-CM

## 2020-09-24 DIAGNOSIS — Z1211 Encounter for screening for malignant neoplasm of colon: Secondary | ICD-10-CM

## 2020-09-24 DIAGNOSIS — B369 Superficial mycosis, unspecified: Secondary | ICD-10-CM

## 2020-09-24 DIAGNOSIS — Z8673 Personal history of transient ischemic attack (TIA), and cerebral infarction without residual deficits: Secondary | ICD-10-CM

## 2020-09-24 DIAGNOSIS — Z131 Encounter for screening for diabetes mellitus: Secondary | ICD-10-CM

## 2020-09-24 DIAGNOSIS — I10 Essential (primary) hypertension: Secondary | ICD-10-CM

## 2020-09-24 DIAGNOSIS — E782 Mixed hyperlipidemia: Secondary | ICD-10-CM

## 2020-09-24 DIAGNOSIS — E7800 Pure hypercholesterolemia, unspecified: Secondary | ICD-10-CM

## 2020-09-24 LAB — POCT GLYCOSYLATED HEMOGLOBIN (HGB A1C): Hemoglobin A1C: 5.4 % (ref 4.0–5.6)

## 2020-09-24 LAB — GLUCOSE, POCT (MANUAL RESULT ENTRY): POC Glucose: 96 mg/dl (ref 70–99)

## 2020-09-24 MED ORDER — NICOTINE POLACRILEX 4 MG MT LOZG: LOZENGE | OROMUCOSAL | 4 refills | Status: DC

## 2020-09-24 MED ORDER — ATORVASTATIN CALCIUM 10 MG PO TABS: 10.0000 mg | ORAL_TABLET | Freq: Every day | ORAL | 2 refills | Status: DC

## 2020-09-24 MED ORDER — HYDROCHLOROTHIAZIDE 12.5 MG PO CAPS: 12.5000 mg | ORAL_CAPSULE | Freq: Every day | ORAL | 2 refills | Status: DC

## 2020-09-24 MED ORDER — ASPIRIN 81 MG PO TBEC: 81.0000 mg | DELAYED_RELEASE_TABLET | Freq: Every day | ORAL | 2 refills | Status: DC

## 2020-09-24 MED ORDER — TRIAMCINOLONE ACETONIDE 0.1 % EX CREA: TOPICAL_CREAM | Freq: Two times a day (BID) | CUTANEOUS | 0 refills | Status: DC

## 2020-09-24 MED FILL — SM NICOTINE 4 MG LOZENGE: 4 | 24 days supply | Qty: 72 | Fill #0

## 2020-09-24 MED FILL — TRIAMCINOLONE ACETONIDE 0.1: 0.1 | 30 days supply | Qty: 80 | Fill #0

## 2020-09-24 MED FILL — ATORVASTATIN 10 MG TABLET: 10 | 30 days supply | Qty: 30 | Fill #0

## 2020-09-24 MED FILL — HYDROCHLOROTHIAZIDE 12.5 MG: 12.5 | 30 days supply | Qty: 30 | Fill #0

## 2020-09-24 NOTE — Assessment & Plan Note (Addendum)
Hypertension will resume hydrocal thiazide 12.5 mg daily blood pressure actually quite good today at 135/86  Patient instructed as to proper DASH diet

## 2020-09-24 NOTE — Assessment & Plan Note (Signed)
Ongoing tobacco use     . Current smoking consumption amount: 1 pack a day  . Dicsussion on advise to quit smoking and smoking impacts:  Cardiovascular impacts . Patient's willingness to quit: Willing to quit  . Methods to quit smoking discussed:   Behavioral modification nicotine replacement . Medication management of smoking session drugs discussed: Nicotine lozenges  . Resources provided:  AVS   . Setting quit date not established  . Follow-up arranged 2 months   Time spent counseling the patient: 5 minutes

## 2020-09-24 NOTE — Assessment & Plan Note (Signed)
Resume Lipitor 10 mg daily

## 2020-09-24 NOTE — Progress Notes (Signed)
Subjective:    Patient ID: Cameron Bates, male    DOB: Mar 23, 1956, 64 y.o.   MRN: [id number]  64 y.o.M here for PCP to establish c/o rash on stomach  This is a patient was seen initially in the mobile medicine unit now here to establish for primary care.  Patient has history of hypertension prior history of stroke with cardiovascular disease tobacco use prior alcohol use and complains of lower abdominal wall rash.  The patient also complains of poor dentition.  He is yet to receive a Covid vaccine and appears to be adamantly against receiving any vaccines.  He has no other specific complaints today at this visit and no other specific symptoms to relate  He has a negative past medical history except for a stroke in the past he is to be on aspirin and atorvastatin is out of these medications at this time  Past Medical History:  Diagnosis Date  . Hyperlipidemia   . Hypertension   . Tobacco abuse      Family History  Problem Relation Age of Onset  . Hypertension Mother      Social History   Socioeconomic History  . Marital status: Married    Spouse name: Not on file  . Number of children: Not on file  . Years of education: Not on file  . Highest education level: Not on file  Occupational History  . Occupation: Photographer  Tobacco Use  . Smoking status: Current Every Day Smoker    Packs/day: 0.50    Years: 40.00    Pack years: 20.00    Types: Cigarettes  . Smokeless tobacco: Never Used  Vaping Use  . Vaping Use: Never used  Substance and Sexual Activity  . Alcohol use: Yes    Alcohol/week: 2.0 standard drinks    Types: 2 Cans of beer per week  . Drug use: No  . Sexual activity: Not Currently  Other Topics Concern  . Not on file  Social History Narrative  . Not on file   Social Determinants of Health   Financial Resource Strain: Not on file  Food Insecurity: Not on file  Transportation Needs: Not on file  Physical Activity: Not on file  Stress: Not on file   Social Connections: Not on file  Intimate Partner Violence: Not on file     No Known Allergies   Outpatient Medications Prior to Visit  Medication Sig Dispense Refill  . aspirin 81 MG EC tablet Take 1 tablet (81 mg total) daily by mouth. Swallow whole. (Patient not taking: No sig reported) 90 tablet 2  . atorvastatin (LIPITOR) 10 MG tablet Take 1 tablet (10 mg total) by mouth daily. (Patient not taking: Reported on 09/24/2020) 30 tablet 2  . hydrochlorothiazide (MICROZIDE) 12.5 MG capsule Take 1 capsule (12.5 mg total) daily by mouth. (Patient not taking: No sig reported) 90 capsule 2  . Multiple Vitamin (MULTIVITAMIN WITH MINERALS) TABS tablet Take 1 tablet by mouth daily. (Patient not taking: Reported on 09/24/2020) 60 tablet 0  . nicotine (NICODERM CQ - DOSED IN MG/24 HR) 7 mg/24hr patch Place 1 patch (7 mg total) daily onto the skin. (Patient not taking: No sig reported) 28 patch 0  . nicotine polacrilex (NICORETTE) 2 MG gum TAKE 1 EACH (2 MG TOTAL) AS NEEDED BY MOUTH FOR SMOKING CESSATION. (Patient not taking: No sig reported) 100 each 0  . nystatin-triamcinolone ointment (MYCOLOG) Apply 1 application topically 2 (two) times daily. (Patient not taking: Reported on 09/24/2020)  60 g 0   No facility-administered medications prior to visit.      Review of Systems  Constitutional: Negative.   HENT: Negative.   Eyes: Negative.   Respiratory: Negative.   Cardiovascular: Negative.   Gastrointestinal: Negative.   Endocrine: Negative.   Genitourinary: Negative.   Musculoskeletal: Negative.   Skin: Positive for rash.  Neurological: Negative.   Psychiatric/Behavioral: Negative.        Objective:   Physical Exam Vitals:   09/24/20 0908  BP: 135/86  Pulse: 95  SpO2: 100%  Weight: 213 lb (96.6 kg)  Height: 5' 11.89" (1.826 m)    Gen: Pleasant, well-nourished, in no distress,  normal affect  ENT: No lesions,  mouth clear,  oropharynx clear, no postnasal drip  Neck: No JVD, no  TMG, no carotid bruits  Lungs: No use of accessory muscles, no dullness to percussion, clear without rales or rhonchi  Cardiovascular: RRR, heart sounds normal, no murmur or gallops, no peripheral edema  Abdomen: soft and NT, no HSM,  BS normal  Musculoskeletal: No deformities, no cyanosis or clubbing  Neuro: alert, non focal  Skin: Warm, rashes seen over the suprapubic area up into the mid abdomen  No results found.   Lab Results  Component Value Date   HGBA1C 5.4 09/24/2020        Assessment & Plan:  I personally reviewed all images and lab data in the [facility] system as well as any outside material available during this office visit and agree with the  radiology impressions.   History of transient ischemic attack (TIA) History of TIA with associated hypertension and hyperlipidemia  Resume Lipitor will send prescription 10 mg daily  Resume aspirin 81 mg daily  Smoking cessation recommended  Hyperlipidemia Resume Lipitor 10 mg daily  Tobacco use Ongoing tobacco use     . Current smoking consumption amount: 1 pack a day  . Dicsussion on advise to quit smoking and smoking impacts:  Cardiovascular impacts . Patient's willingness to quit: Willing to quit  . Methods to quit smoking discussed:   Behavioral modification nicotine replacement . Medication management of smoking session drugs discussed: Nicotine lozenges  . Resources provided:  AVS   . Setting quit date not established  . Follow-up arranged 2 months   Time spent counseling the patient: 5 minutes   Essential hypertension Hypertension will resume hydrocal thiazide 12.5 mg daily blood pressure actually quite good today at 135/86  Patient instructed as to proper DASH diet   Cameron Bates was seen today for establish care.  Diagnoses and all orders for this visit:  Diabetes mellitus screening -     POCT glucose (manual entry) -     POCT glycosylated hemoglobin (Hb A1C)  Cardiovascular disease -      atorvastatin (LIPITOR) 10 MG tablet; Take 1 tablet (10 mg total) by mouth daily.  Elevated LDL cholesterol level -     atorvastatin (LIPITOR) 10 MG tablet; Take 1 tablet (10 mg total) by mouth daily.  Dermatitis fungal -     triamcinolone (KENALOG) 0.1 %; Apply topically 2 (two) times daily. Affected area on abdomen  Colon cancer screening -     Fecal occult blood, imunochemical  History of transient ischemic attack (TIA)  Mixed hyperlipidemia  Tobacco use  Essential hypertension  Other orders -     aspirin 81 MG EC tablet; Take 1 tablet (81 mg total) by mouth daily. Swallow whole. -     hydrochlorothiazide (MICROZIDE) 12.5 MG capsule; Take  1 capsule (12.5 mg total) by mouth daily. -     nicotine polacrilex (NICORETTE MINI) 4 MG lozenge; Take one three times a day to quit smoking  Note no indication for diabetic medication A1c is normal

## 2020-09-24 NOTE — Progress Notes (Signed)
Establish care Rash on stomach itchy/dry -2 months

## 2020-09-24 NOTE — Patient Instructions (Signed)
Begin hydrochlorthiazide 1 daily for blood pressure Resume aspirin 1 daily Begin atorvastatin 1 daily for cholesterol Focus on smoking cessation use the nicotine lozenges 3 times daily and review below  Please consider getting a Covid vaccine see below for or you can find one COVID-19 Vaccine Information can be found at: [email or web address] For questions related to vaccine distribution or appointments, please email [email or web address] or call [phone].     A stool kit was given for you to obtain a stool sample and send her drop back off so we can screen you for colon cancer  Focus on a healthy diet for your blood pressure and cholesterol see below  Please have your teeth extracted we will give you some dental clinics that you can go to for that with your insurance  Return to see Dr. Joya Gaskins in February   [place] stands for "Dietary Approaches to Stop Hypertension." The DASH eating plan is a healthy eating plan that has been shown to reduce high blood pressure (hypertension). It may also reduce your risk for type 2 diabetes, heart disease, and stroke. The DASH eating plan may also help with weight loss. What are tips for following this plan?  General guidelines  Avoid eating more than 2,300 mg (milligrams) of salt (sodium) a day. If you have hypertension, you may need to reduce your sodium intake to 1,500 mg a day.  Limit alcohol intake to no more than 1 drink a day for nonpregnant women and 2 drinks a day for men. One drink equals 12 oz of beer, 5 oz of wine, or 1 oz of hard liquor.  Work with your health care provider to maintain a healthy body weight or to lose weight. Ask what an ideal weight is for you.  Get at least 30 minutes of exercise that causes your heart to beat faster (aerobic exercise) most days of the week. Activities may include walking, swimming, or biking.  Work with your health care  provider or diet and nutrition specialist (dietitian) to adjust your eating plan to your individual calorie needs. Reading food labels   Check food labels for the amount of sodium per serving. Choose foods with less than 5 percent of the Daily Value of sodium. Generally, foods with less than 300 mg of sodium per serving fit into this eating plan.  To find whole grains, look for the word "whole" as the first word in the ingredient list. Shopping  Buy products labeled as "low-sodium" or "no salt added."  Buy fresh foods. Avoid canned foods and premade or frozen meals. Cooking  Avoid adding salt when cooking. Use salt-free seasonings or herbs instead of table salt or sea salt. Check with your health care provider or pharmacist before using salt substitutes.  Do not fry foods. Cook foods using healthy methods such as baking, boiling, grilling, and broiling instead.  Cook with heart-healthy oils, such as olive, canola, soybean, or sunflower oil. Meal planning  Eat a balanced diet that includes: ? 5 or more servings of fruits and vegetables each day. At each meal, try to fill half of your plate with fruits and vegetables. ? Up to 6-8 servings of whole grains each day. ? Less than 6 oz of lean meat, poultry, or fish each day. A 3-oz serving of meat is about the same size as a deck of cards. One egg equals 1 oz. ? 2 servings of low-fat dairy each day. ? A serving of nuts, seeds, or beans 5 times each week. ?  Heart-healthy fats. Healthy fats called Omega-3 fatty acids are found in foods such as flaxseeds and coldwater fish, like sardines, salmon, and mackerel.  Limit how much you eat of the following: ? Canned or prepackaged foods. ? Food that is high in trans fat, such as fried foods. ? Food that is high in saturated fat, such as fatty meat. ? Sweets, desserts, sugary drinks, and other foods with added sugar. ? Full-fat dairy products.  Do not salt foods before eating.  Try to eat at  least 2 vegetarian meals each week.  Eat more home-cooked food and less restaurant, buffet, and fast food.  When eating at a restaurant, ask that your food be prepared with less salt or no salt, if possible. What foods are recommended? The items listed may not be a complete list. Talk with your dietitian about what dietary choices are best for you. Grains Whole-grain or whole-wheat bread. Whole-grain or whole-wheat pasta. Brown rice. [place]. Bulgur. Whole-grain and low-sodium cereals. Pita bread. Low-fat, low-sodium crackers. Whole-wheat flour tortillas. Vegetables Fresh or frozen vegetables (raw, steamed, roasted, or grilled). Low-sodium or reduced-sodium tomato and vegetable juice. Low-sodium or reduced-sodium tomato sauce and tomato paste. Low-sodium or reduced-sodium canned vegetables. Fruits All fresh, dried, or frozen fruit. Canned fruit in natural juice (without added sugar). Meat and other protein foods Skinless chicken or [place]. Ground chicken or [place]. Pork with fat trimmed off. Fish and seafood. Egg whites. Dried beans, peas, or lentils. Unsalted nuts, nut butters, and seeds. Unsalted canned beans. Lean cuts of beef with fat trimmed off. Low-sodium, lean deli meat. Dairy Low-fat (1%) or fat-free (skim) milk. Fat-free, low-fat, or reduced-fat cheeses. Nonfat, low-sodium ricotta or cottage cheese. Low-fat or nonfat yogurt. Low-fat, low-sodium cheese. Fats and oils Soft margarine without trans fats. Vegetable oil. Low-fat, reduced-fat, or light mayonnaise and salad dressings (reduced-sodium). Canola, safflower, olive, soybean, and sunflower oils. Avocado. Seasoning and other foods Herbs. Spices. Seasoning mixes without salt. Unsalted popcorn and pretzels. Fat-free sweets. What foods are not recommended? The items listed may not be a complete list. Talk with your dietitian about what dietary choices are best for you. Grains Baked goods made with fat, such as croissants,  muffins, or some breads. Dry pasta or rice meal packs. Vegetables Creamed or fried vegetables. Vegetables in a cheese sauce. Regular canned vegetables (not low-sodium or reduced-sodium). Regular canned tomato sauce and paste (not low-sodium or reduced-sodium). Regular tomato and vegetable juice (not low-sodium or reduced-sodium). Angie Fava. Olives. Fruits Canned fruit in a light or heavy syrup. Fried fruit. Fruit in cream or butter sauce. Meat and other protein foods Fatty cuts of meat. Ribs. Fried meat. Berniece Salines. Sausage. Bologna and other processed lunch meats. Salami. Fatback. Hotdogs. Bratwurst. Salted nuts and seeds. Canned beans with added salt. Canned or smoked fish. Whole eggs or egg yolks. Chicken or [place] with skin. Dairy Whole or 2% milk, cream, and half-and-half. Whole or full-fat cream cheese. Whole-fat or sweetened yogurt. Full-fat cheese. Nondairy creamers. Whipped toppings. Processed cheese and cheese spreads. Fats and oils Butter. Stick margarine. Lard. Shortening. Ghee. Bacon fat. Tropical oils, such as coconut, palm kernel, or palm oil. Seasoning and other foods Salted popcorn and pretzels. Onion salt, garlic salt, seasoned salt, table salt, and sea salt. Worcestershire sauce. Tartar sauce. Barbecue sauce. Teriyaki sauce. Soy sauce, including reduced-sodium. Steak sauce. Canned and packaged gravies. Fish sauce. Oyster sauce. Cocktail sauce. Horseradish that you find on the shelf. Ketchup. Mustard. Meat flavorings and tenderizers. Bouillon cubes. Hot sauce and Tabasco sauce.  Premade or packaged marinades. Premade or packaged taco seasonings. Relishes. Regular salad dressings. Where to find more information:  National Heart, Lung, and [place]: https://wilson-eaton.com/  American Heart Association: www.heart.org Summary  The DASH eating plan is a healthy eating plan that has been shown to reduce high blood pressure (hypertension). It may also reduce your risk for type 2 diabetes,  heart disease, and stroke.  With the DASH eating plan, you should limit salt (sodium) intake to 2,300 mg a day. If you have hypertension, you may need to reduce your sodium intake to 1,500 mg a day.  When on the DASH eating plan, aim to eat more fresh fruits and vegetables, whole grains, lean proteins, low-fat dairy, and heart-healthy fats.  Work with your health care provider or diet and nutrition specialist (dietitian) to adjust your eating plan to your individual calorie needs. This information is not intended to replace advice given to you by your health care provider. Make sure you discuss any questions you have with your health care provider. Document Revised: 09/15/2017 Document Reviewed: 09/26/2016 Elsevier Patient Education  [place].  Tobacco Use Disorder Tobacco use disorder (TUD) occurs when a person craves, seeks, and uses tobacco, regardless of the consequences. This disorder can cause problems with mental and physical health. It can affect your ability to have healthy relationships, and it can keep you from meeting your responsibilities at work, home, or school. Tobacco may be:  Smoked as a cigarette or cigar.  Inhaled using e-cigarettes.  Smoked in a pipe or hookah.  Chewed as smokeless tobacco.  Inhaled into the nostrils as snuff. Tobacco products contain a dangerous chemical called nicotine, which is very addictive. Nicotine triggers hormones that make the body feel stimulated and works on areas of the brain that make you feel good. These effects can make it hard for people to quit nicotine. Tobacco contains many other unsafe chemicals that can damage almost every organ in the body. Smoking tobacco also puts others in danger due to fire risk and possible health problems caused by breathing in secondhand smoke. What are the signs or symptoms? Symptoms of TUD may include:  Being unable to slow down or stop your tobacco use.  Spending an abnormal amount of time  getting or using tobacco.  Craving tobacco. Cravings may last for up to 6 months after quitting.  Tobacco use that: ? Interferes with your work, school, or home life. ? Interferes with your personal and social relationships. ? Makes you give up activities that you once enjoyed or found important.  Using tobacco even though you know that it is: ? Dangerous or bad for your health or someone else's health. ? Causing problems in your life.  Needing more and more of the substance to get the same effect (developing tolerance).  Experiencing unpleasant symptoms if you do not use the substance (withdrawal). Withdrawal symptoms may include: ? Depressed, anxious, or irritable mood. ? Difficulty concentrating. ? Increased appetite. ? Restlessness or trouble sleeping.  Using the substance to avoid withdrawal. How is this diagnosed? This condition may be diagnosed based on:  Your current and past tobacco use. Your health care provider may ask questions about how your tobacco use affects your life.  A physical exam. You may be diagnosed with TUD if you have at least two symptoms within a 85-month period. How is this treated? This condition is treated by stopping tobacco use. Many people are unable to quit on their own and need help. Treatment may  include:  Nicotine replacement therapy (NRT). NRT provides nicotine without the other harmful chemicals in tobacco. NRT gradually lowers the dosage of nicotine in the body and reduces withdrawal symptoms. NRT is available as: ? Over-the-counter gums, lozenges, and skin patches. ? Prescription mouth inhalers and nasal sprays.  Medicine that acts on the brain to reduce cravings and withdrawal symptoms.  A type of talk therapy that examines your triggers for tobacco use, how to avoid them, and how to cope with cravings (behavioral therapy).  Hypnosis. This may help with withdrawal symptoms.  Joining a support group for others coping with TUD. The  best treatment for TUD is usually a combination of medicine, talk therapy, and support groups. Recovery can be a long process. Many people start using tobacco again after stopping (relapse). If you relapse, it does not mean that treatment will not work. Follow these instructions at home:  Lifestyle  Do not use any products that contain nicotine or tobacco, such as cigarettes and e-cigarettes.  Avoid things that trigger tobacco use as much as you can. Triggers include people and situations that usually cause you to use tobacco.  Avoid drinks that contain caffeine, including coffee. These may worsen some withdrawal symptoms.  Find ways to manage stress. Wanting to smoke may cause stress, and stress can make you want to smoke. Relaxation techniques such as deep breathing, meditation, and yoga may help.  Attend support groups as needed. These groups are an important part of long-term recovery for many people. General instructions  Take over-the-counter and prescription medicines only as told by your health care provider.  Check with your health care provider before taking any new prescription or over-the-counter medicines.  Decide on a friend, family member, or smoking quit-line (such as 1-800-QUIT-NOW in the [place] that you can call or text when you feel the urge to smoke or when you need help coping with cravings.  Keep all follow-up visits as told by your health care provider and therapist. This is important. Contact a health care provider if:  You are not able to take your medicines as prescribed.  Your symptoms get worse, even with treatment. Summary  Tobacco use disorder (TUD) occurs when a person craves, seeks, and uses tobacco regardless of the consequences.  This condition may be diagnosed based on your current and past tobacco use and a physical exam.  Many people are unable to quit on their own and need help. Recovery can be a long process.  The most effective treatment for  TUD is usually a combination of medicine, talk therapy, and support groups. This information is not intended to replace advice given to you by your health care provider. Make sure you discuss any questions you have with your health care provider. Document Revised: 09/20/2017 Document Reviewed: 09/20/2017 Elsevier Patient Education  2020 [employer].

## 2020-09-24 NOTE — Assessment & Plan Note (Signed)
History of TIA with associated hypertension and hyperlipidemia  Resume Lipitor will send prescription 10 mg daily  Resume aspirin 81 mg daily  Smoking cessation recommended

## 2020-10-21 ENCOUNTER — Other Ambulatory Visit: Payer: No Typology Code available for payment source

## 2020-10-21 ENCOUNTER — Other Ambulatory Visit: Payer: Self-pay

## 2020-10-21 DIAGNOSIS — Z20822 Contact with and (suspected) exposure to covid-19: Secondary | ICD-10-CM

## 2020-10-22 LAB — NOVEL CORONAVIRUS, NAA: SARS-CoV-2, NAA: NOT DETECTED

## 2020-10-22 LAB — SARS-COV-2, NAA 2 DAY TAT

## 2023-01-10 ENCOUNTER — Encounter (HOSPITAL_COMMUNITY): Payer: Self-pay

## 2023-01-10 ENCOUNTER — Ambulatory Visit (HOSPITAL_COMMUNITY)
Admission: EM | Admit: 2023-01-10 | Discharge: 2023-01-10 | Disposition: A | Discharge status: Home / Self Care | Payer: Medicare Other | Attending: Internal Medicine | Admitting: Internal Medicine

## 2023-01-10 DIAGNOSIS — R311 Benign essential microscopic hematuria: Secondary | ICD-10-CM | POA: Diagnosis not present

## 2023-01-10 DIAGNOSIS — I10 Essential (primary) hypertension: Secondary | ICD-10-CM | POA: Insufficient documentation

## 2023-01-10 DIAGNOSIS — M545 Low back pain, unspecified: Secondary | ICD-10-CM | POA: Diagnosis not present

## 2023-01-10 LAB — BASIC METABOLIC PANEL
Anion gap: 12 (ref 5–15)
BUN: 7 mg/dL — ABNORMAL LOW (ref 8–23)
CO2: 23 mmol/L (ref 22–32)
Calcium: 9.8 mg/dL (ref 8.9–10.3)
Chloride: 101 mmol/L (ref 98–111)
Creatinine, Ser: 0.81 mg/dL (ref 0.61–1.24)
GFR, Estimated: >60 mL/min (ref >60)
Glucose, Bld: 109 mg/dL — ABNORMAL HIGH (ref 70–99)
Potassium: 3.9 mmol/L (ref 3.5–5.1)
Sodium: 136 mmol/L (ref 135–145)

## 2023-01-10 LAB — CBC
HCT: 52.6 % — ABNORMAL HIGH (ref 39.0–52.0)
Hemoglobin: 17.2 g/dL — ABNORMAL HIGH (ref 13.0–17.0)
MCH: 29.5 pg (ref 26.0–34.0)
MCHC: 32.7 g/dL (ref 30.0–36.0)
MCV: 90.1 fL (ref 80.0–100.0)
Platelets: 243 10*3/uL (ref 150–400)
RBC: 5.84 MIL/uL — ABNORMAL HIGH (ref 4.22–5.81)
RDW: 13.3 % (ref 11.5–15.5)
WBC: 6.4 10*3/uL (ref 4.0–10.5)
nRBC: 0 % (ref 0.0–0.2)

## 2023-01-10 LAB — POCT URINALYSIS DIPSTICK, ED / UC
Bilirubin Urine: NEGATIVE
Glucose, UA: NEGATIVE mg/dL
Ketones, ur: NEGATIVE mg/dL
Leukocytes,Ua: NEGATIVE
Nitrite: NEGATIVE
Protein, ur: 30 mg/dL — AB
Specific Gravity, Urine: 1.025 (ref 1.005–1.030)
Urobilinogen, UA: 0.2 mg/dL (ref 0.0–1.0)
pH: 6 (ref 5.0–8.0)

## 2023-01-10 MED ORDER — AMLODIPINE BESYLATE 5 MG PO TABS: 5.0000 mg | ORAL_TABLET | Freq: Every day | ORAL | 1 refills | Status: DC

## 2023-01-10 NOTE — ED Triage Notes (Signed)
Pt states left flank pain for the past 3 days.  States he has not taken anything at home for the pain.

## 2023-01-10 NOTE — Discharge Instructions (Addendum)
Your urine shows lots of blood in your urine today. I believe this is because you are dehydrated and have been drinking too much caffeine and not enough water. Reduce the amount of caffeine you are drinking significantly.  Please drink at least 64 ounces of water per day to stay well-hydrated.  I have checked blood work today to make sure that your kidneys are okay.  Staff will call you if there is anything abnormal on your blood work.  Please come back in 1 week to have your urine rechecked for blood.  In the next week, I would like for you to push fluids and hydrate.  You may take Tylenol as needed for pain to the low back.  Avoid taking ibuprofen.   If you develop any new or worsening symptoms or do not improve in the next 2 to 3 days, please return.  If your symptoms are severe, please go to the emergency room.  Follow-up with your primary care provider for further evaluation and management of your symptoms as well as ongoing wellness visits.  I hope you feel better!

## 2023-01-10 NOTE — ED Provider Notes (Signed)
[place]    CSN: [id number] Arrival date & time: 01/10/23  1338      History   Chief Complaint Chief Complaint  Patient presents with   Flank Pain    HPI Cameron Bates is a 67 y.o. male.   Patient presents to urgent care for evaluation of left-sided low back pain that started 3 to 4 days ago.  He states pain is intermittent and he cannot identify any triggering or relieving factors for pain.  Reports pain is to his flank, however points to the left low back when indicating area of discomfort. Denies urinary symptoms, urinary/stool incontinence, numbness/tingling to the bilateral lower extremities, saddle anesthesia symptoms, weakness, headache, blurry vision, dizziness, and fever/chills. No constipation, diarrhea, nausea, vomiting, or blood/mucous to the stools reported. No recent falls, injuries, or trauma to the area of greatest tenderness.  He is a current everyday cigarette smoker and has been for 30 years.  Denies gross hematuria, night sweats and recent weight loss without trying.  Pain to the low back is worse with movement.  Denies history of spinal problems or surgeries.  No recent antibiotic or steroid use.  Denies history of frequent urinary tract infections.  Denies history of prostate problems and pain with defecation.  He does not have a primary care provider.  Reports he drinks lots of caffeine and soda.  He drinks minimal water and drinks approximately 1-2 bottles of water per day.  States he recently cut back on his soda and caffeine use (today).  Has not used any over-the-counter medications to help with symptoms before coming to urgent care.   Flank Pain    Past Medical History:  Diagnosis Date   Hyperlipidemia    Hypertension    Tobacco abuse     Patient Active Problem List   Diagnosis Date Noted   History of transient ischemic attack (TIA) 09/24/2020   Elevated LDL cholesterol level 08/06/2020   Hyperlipidemia 12/13/2016   Healthcare  maintenance 12/12/2016   Essential hypertension 12/12/2016   Tobacco use 11/25/2016    History reviewed. No pertinent surgical history.     Home Medications    Prior to Admission medications   Medication Sig Start Date End Date Taking? Authorizing Provider  aspirin 81 MG EC tablet Take 1 tablet (81 mg total) by mouth daily. Swallow whole. 09/24/20   Elsie Stain, MD  atorvastatin (LIPITOR) 10 MG tablet Take 1 tablet (10 mg total) by mouth daily. 09/24/20   Elsie Stain, MD  hydrochlorothiazide (MICROZIDE) 12.5 MG capsule Take 1 capsule (12.5 mg total) by mouth daily. 09/24/20   Elsie Stain, MD  nicotine polacrilex (NICORETTE MINI) 4 MG lozenge Take one three times a day to quit smoking 09/24/20   Elsie Stain, MD  triamcinolone (KENALOG) 0.1 % Apply topically 2 (two) times daily. Affected area on abdomen 09/24/20   Elsie Stain, MD    Family History Family History  Problem Relation Age of Onset   Hypertension Mother     Social History Social History   Tobacco Use   Smoking status: Every Day    Packs/day: 0.50    Years: 40.00    Additional pack years: 0.00    Total pack years: 20.00    Types: Cigarettes   Smokeless tobacco: Never  Vaping Use   Vaping Use: Never used  Substance Use Topics   Alcohol use: Yes    Alcohol/week: 2.0 standard drinks of alcohol    Types: 2 Cans  of beer per week   Drug use: No     Allergies   Patient has no known allergies.   Review of Systems Review of Systems  Genitourinary:  Positive for flank pain.  HPI   Physical Exam Triage Vital Signs ED Triage Vitals  Enc Vitals Group     BP 01/10/23 1356 (!) 189/109     Pulse Rate 01/10/23 1356 78     Resp 01/10/23 1356 16     Temp 01/10/23 1356 98.1 F (36.7 C)     Temp Source 01/10/23 1356 Oral     SpO2 01/10/23 1356 96 %     Weight --      Height --      Head Circumference --      Peak Flow --      Pain Score 01/10/23 1357 9     Pain Loc --      Pain  Edu? --      Excl. in [place]? --    No data found.  Updated Vital Signs BP (!) 189/109 (BP Location: Right Arm)   Pulse 78   Temp 98.1 F (36.7 C) (Oral)   Resp 16   SpO2 96%   Visual Acuity Right Eye Distance:   Left Eye Distance:   Bilateral Distance:    Right Eye Near:   Left Eye Near:    Bilateral Near:     Physical Exam Vitals and nursing note reviewed.  Constitutional:      Appearance: He is not ill-appearing or toxic-appearing.  HENT:     Head: Normocephalic and atraumatic.     Right Ear: Hearing and external ear normal.     Left Ear: Hearing and external ear normal.     Nose: Nose normal.     Mouth/Throat:     Lips: Pink.     Mouth: Mucous membranes are moist. No injury.     Tongue: No lesions. Tongue does not deviate from midline.     Palate: No mass and lesions.     Pharynx: Oropharynx is clear. Uvula midline. No pharyngeal swelling, oropharyngeal exudate, posterior oropharyngeal erythema or uvula swelling.     Tonsils: No tonsillar exudate or tonsillar abscesses.  Eyes:     General: Lids are normal. Vision grossly intact. Gaze aligned appropriately.     Extraocular Movements: Extraocular movements intact.     Conjunctiva/sclera: Conjunctivae normal.  Cardiovascular:     Rate and Rhythm: Normal rate and regular rhythm.     Heart sounds: Normal heart sounds, S1 normal and S2 normal.  Pulmonary:     Effort: Pulmonary effort is normal. No respiratory distress.     Breath sounds: Normal breath sounds and air entry.  Musculoskeletal:     Cervical back: Normal and neck supple.     Thoracic back: Normal.     Lumbar back: Normal. No swelling, edema, deformity, signs of trauma, lacerations, spasms, tenderness or bony tenderness. Normal range of motion. Negative right straight leg raise test and negative left straight leg raise test. No scoliosis.     Comments: No tenderness elicited to palpation of the lumbar spinous processes or lumbar paraspinals.  Normal  musculoskeletal exam findings.  Ambulatory with steady gait without difficulty.  5/5 strength of bilateral lower extremities.  Strength and sensation intact bilaterally.  Skin:    General: Skin is warm and dry.     Capillary Refill: Capillary refill takes less than 2 seconds.     Findings: No rash.  Neurological:     General: No focal deficit present.     Mental Status: He is alert and oriented to person, place, and time. Mental status is at baseline.     Cranial Nerves: No dysarthria or facial asymmetry.  Psychiatric:        Mood and Affect: Mood normal.        Speech: Speech normal.        Behavior: Behavior normal.        Thought Content: Thought content normal.        Judgment: Judgment normal.      UC Treatments / Results  Labs (all labs ordered are listed, but only abnormal results are displayed) Labs Reviewed  POCT URINALYSIS DIPSTICK, ED / UC - Abnormal; Notable for the following components:      Result Value   Hgb urine dipstick MODERATE (*)    Protein, ur 30 (*)    All other components within normal limits  BASIC METABOLIC PANEL  CBC    EKG   Radiology No results found.  Procedures Procedures (including critical care time)  Medications Ordered in UC Medications - No data to display  Initial Impression / Assessment and Plan / UC Course  I have reviewed the triage vital signs and the nursing notes.  Pertinent labs & imaging results that were available during my care of the patient were reviewed by me and considered in my medical decision making (see chart for details).   1.  Benign essential microscopic hematuria, acute left-sided low back pain without sciatica Unclear etiology of patient's symptoms.  No pain is elicited to palpation to the area of greatest tenderness described.  He does not describe any sensation of pain moving downward consistent with nephrolithiasis.  No personal or family history of kidney stones.  He does not appear to be dehydrated to  physical exam and has hemodynamically stable vital signs.  Deferred imaging based on stable musculoskeletal exam findings.  Neurologically intact to baseline.  No signs of infection to the UA.  Advised to decrease amount of caffeine he is drinking and increased water intake.  Suspect hematuria may be due to an acute kidney injury.  Basic blood work drawn today to assess for kidney function, electrolyte imbalances, and blood levels.  Advised patient to push lots of fluids.   2. Essential hypertension BP elevated, states he has been taking his medications as prescribed (hydrochlorothiazide). Would like to add on amlodipine to medication regimen as BP very high here at 189/109. No red flag signs/symptoms indicating need for referral to ED at this time for further workup or evaluation.  PCP assistance initiated today.  Advised to follow-up for ongoing evaluation and management of blood pressure medications.  Patient has a history of TIA and it is imperative that his blood pressure comes down to prevent future TIA, MI, and stroke as he is also a current everyday cigarette smoker and at high risk for preventative illnesses.   Discussed physical exam and available lab work findings in clinic with patient.  Counseled patient regarding appropriate use of medications and potential side effects for all medications recommended or prescribed today. Discussed red flag signs and symptoms of worsening condition,when to call the PCP office, return to urgent care, and when to seek higher level of care in the emergency department. Patient verbalizes understanding and agreement with plan. All questions answered. Patient discharged in stable condition.   Final Clinical Impressions(s) / UC Diagnoses   Final diagnoses:  Benign essential  microscopic hematuria  Acute left-sided low back pain without sciatica     Discharge Instructions      Your urine shows lots of blood in your urine today. I believe this is because you  are dehydrated and have been drinking too much caffeine and not enough water. Reduce the amount of caffeine you are drinking significantly.  Please drink at least 64 ounces of water per day to stay well-hydrated.  I have checked blood work today to make sure that your kidneys are okay.  Staff will call you if there is anything abnormal on your blood work.  Please come back in 1 week to have your urine rechecked for blood.  In the next week, I would like for you to push fluids and hydrate.  You may take Tylenol as needed for pain to the low back.  Avoid taking ibuprofen.   If you develop any new or worsening symptoms or do not improve in the next 2 to 3 days, please return.  If your symptoms are severe, please go to the emergency room.  Follow-up with your primary care provider for further evaluation and management of your symptoms as well as ongoing wellness visits.  I hope you feel better!    ED Prescriptions   None    PDMP not reviewed this encounter.   Talbot Grumbling, [place] 01/10/23 [id number]

## 2023-01-17 ENCOUNTER — Encounter (HOSPITAL_COMMUNITY): Payer: Self-pay

## 2023-01-17 ENCOUNTER — Ambulatory Visit (HOSPITAL_COMMUNITY)
Admission: EM | Admit: 2023-01-17 | Discharge: 2023-01-17 | Disposition: A | Discharge status: Home / Self Care | Payer: Medicare Other | Attending: Emergency Medicine | Admitting: Emergency Medicine

## 2023-01-17 DIAGNOSIS — I10 Essential (primary) hypertension: Secondary | ICD-10-CM | POA: Diagnosis not present

## 2023-01-17 DIAGNOSIS — R3129 Other microscopic hematuria: Secondary | ICD-10-CM

## 2023-01-17 LAB — POCT URINALYSIS DIPSTICK, ED / UC
Bilirubin Urine: NEGATIVE
Glucose, UA: NEGATIVE mg/dL
Ketones, ur: NEGATIVE mg/dL
Leukocytes,Ua: NEGATIVE
Nitrite: NEGATIVE
Protein, ur: NEGATIVE mg/dL
Specific Gravity, Urine: 1.015 (ref 1.005–1.030)
Urobilinogen, UA: 2 mg/dL — ABNORMAL HIGH (ref 0.0–1.0)
pH: 6 (ref 5.0–8.0)

## 2023-01-17 MED ORDER — AMLODIPINE BESYLATE 5 MG PO TABS: 5.0000 mg | ORAL_TABLET | Freq: Every day | ORAL | 1 refills | Status: DC

## 2023-01-17 NOTE — ED Provider Notes (Signed)
[place]    CSN: [id number] Arrival date & time: 01/17/23  1108      History   Chief Complaint No chief complaint on file.   HPI Cameron Bates is a 67 y.o. male.   Patient presents today for follow-up.  He was seen on 01/10/2023 at which point he had left-sided abdominal/flank pain.  At that time his urine had protein and hemoglobin but was otherwise normal with no evidence of infection.  He was encouraged to push fluids and return.  Symptoms were attributed to musculoskeletal etiology.  Patient reports that his symptoms have significantly improved and he is no longer experiencing much pain.  He denies any urinary symptoms including frequency, urgency, hematuria.  Denies history of nephrolithiasis.  He has not been evaluated by a urologist.  Denies history of bladder cancer.  He is a smoker but denies any additional risk factors including chemotherapy or heavy metal exposure.  In addition, patient had elevated blood pressure at last visit.  Amlodipine was added to his medication regimen.  He reports compliance with this medication and his blood pressure is much improved today.  He is monitoring his diet for salt and caffeine.  He denies any chest pain, shortness of breath, headache, vision change, dizziness.  Reports that he is not currently following with primary care provider.    Past Medical History:  Diagnosis Date   Hyperlipidemia    Hypertension    Tobacco abuse     Patient Active Problem List   Diagnosis Date Noted   History of transient ischemic attack (TIA) 09/24/2020   Elevated LDL cholesterol level 08/06/2020   Hyperlipidemia 12/13/2016   Healthcare maintenance 12/12/2016   Essential hypertension 12/12/2016   Tobacco use 11/25/2016    History reviewed. No pertinent surgical history.     Home Medications    Prior to Admission medications   Medication Sig Start Date End Date Taking? Authorizing Provider  amLODipine (NORVASC) 5 MG tablet Take 1  tablet (5 mg total) by mouth daily. 01/17/23   Kasper Mudrick, Derry Skill, PA-C  aspirin 81 MG EC tablet Take 1 tablet (81 mg total) by mouth daily. Swallow whole. 09/24/20   Elsie Stain, MD  hydrochlorothiazide (MICROZIDE) 12.5 MG capsule Take 1 capsule (12.5 mg total) by mouth daily. 09/24/20   Elsie Stain, MD    Family History Family History  Problem Relation Age of Onset   Hypertension Mother     Social History Social History   Tobacco Use   Smoking status: Every Day    Packs/day: 0.50    Years: 40.00    Additional pack years: 0.00    Total pack years: 20.00    Types: Cigarettes   Smokeless tobacco: Never  Vaping Use   Vaping Use: Never used  Substance Use Topics   Alcohol use: Yes    Alcohol/week: 2.0 standard drinks of alcohol    Types: 2 Cans of beer per week   Drug use: No     Allergies   Patient has no known allergies.   Review of Systems Review of Systems  Constitutional:  Negative for activity change, appetite change, fatigue and fever.  Gastrointestinal:  Negative for abdominal pain, diarrhea, nausea and vomiting.  Genitourinary:  Negative for dysuria, flank pain, frequency, hematuria and urgency.  Musculoskeletal:  Negative for arthralgias, back pain and myalgias.     Physical Exam Triage Vital Signs ED Triage Vitals [01/17/23 1154]  Enc Vitals Group     BP 109/71  Pulse Rate 69     Resp 14     Temp 98.1 F (36.7 C)     Temp Source Oral     SpO2 98 %     Weight      Height      Head Circumference      Peak Flow      Pain Score 0     Pain Loc      Pain Edu?      Excl. in [place]?    No data found.  Updated Vital Signs BP 109/71 (BP Location: Left Arm)   Pulse 69   Temp 98.1 F (36.7 C) (Oral)   Resp 14   SpO2 98%   Visual Acuity Right Eye Distance:   Left Eye Distance:   Bilateral Distance:    Right Eye Near:   Left Eye Near:    Bilateral Near:     Physical Exam Vitals reviewed.  Constitutional:      General: He is awake.      Appearance: Normal appearance. He is well-developed. He is not ill-appearing.     Comments: Very pleasant male appears stated age in no acute distress sitting comfortably in exam room  HENT:     Head: Normocephalic and atraumatic.     Mouth/Throat:     Pharynx: Uvula midline. No oropharyngeal exudate or posterior oropharyngeal erythema.  Cardiovascular:     Rate and Rhythm: Normal rate and regular rhythm.     Heart sounds: Normal heart sounds, S1 normal and S2 normal. No murmur heard. Pulmonary:     Effort: Pulmonary effort is normal.     Breath sounds: Normal breath sounds. No stridor. No wheezing, rhonchi or rales.     Comments: Clear to auscultation bilaterally Abdominal:     General: Bowel sounds are normal.     Palpations: Abdomen is soft.     Tenderness: There is no abdominal tenderness. There is no right CVA tenderness, left CVA tenderness, guarding or rebound.     Comments: Benign abdominal exam  Musculoskeletal:     Cervical back: No tenderness or bony tenderness.     Thoracic back: No tenderness or bony tenderness.     Lumbar back: No tenderness or bony tenderness.  Neurological:     Mental Status: He is alert.  Psychiatric:        Behavior: Behavior is cooperative.      UC Treatments / Results  Labs (all labs ordered are listed, but only abnormal results are displayed) Labs Reviewed  POCT URINALYSIS DIPSTICK, ED / UC - Abnormal; Notable for the following components:      Result Value   Hgb urine dipstick SMALL (*)    Urobilinogen, UA 2.0 (*)    All other components within normal limits    EKG   Radiology No results found.  Procedures Procedures (including critical care time)  Medications Ordered in UC Medications - No data to display  Initial Impression / Assessment and Plan / UC Course  I have reviewed the triage vital signs and the nursing notes.  Pertinent labs & imaging results that were available during my care of the patient were reviewed by  me and considered in my medical decision making (see chart for details).     Patient is well-appearing and his blood pressure is appropriate today.  He reports that he is tolerating amlodipine well and so was provided a refill.  He is not driving with a primary care so we will  try to establish him with someone via PCP assistance.  Discussed that he is to avoid NSAIDs, caffeine, sodium and monitor his blood pressure at home.  Recommend that he keep a log for evaluation of follow-up appointment.  If he has difficulty filling a PCP he is to return here prior to running out of medication.  Discussed that if he has any worsening symptoms including chest pain, shortness of breath, headache, vision change, dizziness in the setting of high blood pressure he needs to be seen immediately.  Strict return precautions given.  Patient reports that pain has resolved and he is otherwise well-appearing, afebrile, nontoxic, nontachycardic.  UA was repeated that showed small blood.  Review of past UAs indicates that blood has been present for approximately 6 years.  He denies previous cystoscopy or following up with a urologist.  We discussed that this is particularly important in his case given his age and that he is a tobacco smoker and he was given contact information for local provider with instruction to call to schedule an appointment.  Discussed that if he has any worsening or changing symptoms he needs to be seen immediately including frank hematuria or urinary symptoms.  Strict return precautions given.  Final Clinical Impressions(s) / UC Diagnoses   Final diagnoses:  Other microscopic hematuria  Essential hypertension     Discharge Instructions      Your blood pressure is much better!  Continue taking her medication as prescribed.  I have sent in a refill.  We will try to get you established with a new primary care.  Avoid salt, caffeine, NSAIDs (aspirin, ibuprofen/Advil, naproxen/Aleve) monitor your blood  pressure at home and keep a log for evaluation of follow-up appointment.  If you develop any chest pain, shortness of breath, headache, vision change, dizziness in the setting of high blood pressure you need to be seen immediately.  You continue to have some blood in your urine.  It appears this has been present for many years.  Generally we prefer that you see a urologist to have this worked up.  Please call them to schedule an appointment.  Make sure that you are drinking plenty of fluid.  If you have any worsening or changing symptoms you should return for reevaluation.     ED Prescriptions     Medication Sig Dispense Auth. Provider   amLODipine (NORVASC) 5 MG tablet Take 1 tablet (5 mg total) by mouth daily. 30 tablet Nakoma Gotwalt, Derry Skill, PA-C      PDMP not reviewed this encounter.   Terrilee Croak, PA-C 01/17/23 1300

## 2023-01-17 NOTE — Discharge Instructions (Signed)
Your blood pressure is much better!  Continue taking her medication as prescribed.  I have sent in a refill.  We will try to get you established with a new primary care.  Avoid salt, caffeine, NSAIDs (aspirin, ibuprofen/Advil, naproxen/Aleve) monitor your blood pressure at home and keep a log for evaluation of follow-up appointment.  If you develop any chest pain, shortness of breath, headache, vision change, dizziness in the setting of high blood pressure you need to be seen immediately.  You continue to have some blood in your urine.  It appears this has been present for many years.  Generally we prefer that you see a urologist to have this worked up.  Please call them to schedule an appointment.  Make sure that you are drinking plenty of fluid.  If you have any worsening or changing symptoms you should return for reevaluation.

## 2023-01-17 NOTE — ED Triage Notes (Signed)
Patient states he is here for a follow up appointment. Patient states he still has left flank pain, but is intermittent and less painful when he does have pain.

## 2023-02-16 ENCOUNTER — Encounter (HOSPITAL_COMMUNITY): Payer: Self-pay

## 2023-07-21 ENCOUNTER — Other Ambulatory Visit: Payer: Self-pay | Admitting: Student

## 2023-07-21 DIAGNOSIS — F17210 Nicotine dependence, cigarettes, uncomplicated: Secondary | ICD-10-CM

## 2024-02-06 ENCOUNTER — Encounter (HOSPITAL_COMMUNITY): Payer: Self-pay | Admitting: Family Medicine

## 2024-02-06 ENCOUNTER — Other Ambulatory Visit: Payer: Self-pay

## 2024-02-06 ENCOUNTER — Inpatient Hospital Stay (HOSPITAL_COMMUNITY)
Admission: EM | Admit: 2024-02-06 | Discharge: 2024-02-11 | DRG: 564 | Disposition: A | Discharge status: Home / Self Care | Attending: Internal Medicine | Admitting: Internal Medicine

## 2024-02-06 ENCOUNTER — Emergency Department (HOSPITAL_COMMUNITY)

## 2024-02-06 DIAGNOSIS — W19XXXA Unspecified fall, initial encounter: Secondary | ICD-10-CM | POA: Diagnosis present

## 2024-02-06 DIAGNOSIS — Z5901 Sheltered homelessness: Secondary | ICD-10-CM

## 2024-02-06 DIAGNOSIS — I10 Essential (primary) hypertension: Secondary | ICD-10-CM | POA: Diagnosis present

## 2024-02-06 DIAGNOSIS — Z716 Tobacco abuse counseling: Secondary | ICD-10-CM

## 2024-02-06 DIAGNOSIS — Z1152 Encounter for screening for COVID-19: Secondary | ICD-10-CM

## 2024-02-06 DIAGNOSIS — T796XXA Traumatic ischemia of muscle, initial encounter: Secondary | ICD-10-CM | POA: Diagnosis present

## 2024-02-06 DIAGNOSIS — J159 Unspecified bacterial pneumonia: Secondary | ICD-10-CM | POA: Diagnosis present

## 2024-02-06 DIAGNOSIS — K769 Liver disease, unspecified: Secondary | ICD-10-CM | POA: Diagnosis present

## 2024-02-06 DIAGNOSIS — E785 Hyperlipidemia, unspecified: Secondary | ICD-10-CM | POA: Diagnosis present

## 2024-02-06 DIAGNOSIS — S80212A Abrasion, left knee, initial encounter: Secondary | ICD-10-CM | POA: Diagnosis present

## 2024-02-06 DIAGNOSIS — R0682 Tachypnea, not elsewhere classified: Secondary | ICD-10-CM | POA: Diagnosis present

## 2024-02-06 DIAGNOSIS — R000 Tachycardia, unspecified: Secondary | ICD-10-CM | POA: Diagnosis present

## 2024-02-06 DIAGNOSIS — E876 Hypokalemia: Secondary | ICD-10-CM | POA: Diagnosis present

## 2024-02-06 DIAGNOSIS — F17210 Nicotine dependence, cigarettes, uncomplicated: Secondary | ICD-10-CM | POA: Diagnosis present

## 2024-02-06 DIAGNOSIS — Z79899 Other long term (current) drug therapy: Secondary | ICD-10-CM | POA: Diagnosis not present

## 2024-02-06 DIAGNOSIS — Z0000 Encounter for general adult medical examination without abnormal findings: Secondary | ICD-10-CM

## 2024-02-06 DIAGNOSIS — R5381 Other malaise: Secondary | ICD-10-CM | POA: Diagnosis present

## 2024-02-06 DIAGNOSIS — Z8249 Family history of ischemic heart disease and other diseases of the circulatory system: Secondary | ICD-10-CM

## 2024-02-06 DIAGNOSIS — I2489 Other forms of acute ischemic heart disease: Secondary | ICD-10-CM | POA: Diagnosis present

## 2024-02-06 DIAGNOSIS — N179 Acute kidney failure, unspecified: Secondary | ICD-10-CM | POA: Diagnosis present

## 2024-02-06 DIAGNOSIS — R7989 Other specified abnormal findings of blood chemistry: Secondary | ICD-10-CM | POA: Diagnosis present

## 2024-02-06 DIAGNOSIS — F1010 Alcohol abuse, uncomplicated: Secondary | ICD-10-CM | POA: Diagnosis present

## 2024-02-06 DIAGNOSIS — Y9301 Activity, walking, marching and hiking: Secondary | ICD-10-CM | POA: Diagnosis present

## 2024-02-06 DIAGNOSIS — J189 Pneumonia, unspecified organism: Principal | ICD-10-CM | POA: Diagnosis present

## 2024-02-06 DIAGNOSIS — Z7982 Long term (current) use of aspirin: Secondary | ICD-10-CM | POA: Diagnosis not present

## 2024-02-06 DIAGNOSIS — M6282 Rhabdomyolysis: Secondary | ICD-10-CM | POA: Diagnosis present

## 2024-02-06 LAB — RAPID URINE DRUG SCREEN, HOSP PERFORMED
Amphetamines: NOT DETECTED
Barbiturates: NOT DETECTED
Benzodiazepines: NOT DETECTED
Cocaine: NOT DETECTED
Opiates: NOT DETECTED
Tetrahydrocannabinol: POSITIVE — AB

## 2024-02-06 LAB — CBC WITH DIFFERENTIAL/PLATELET
Abs Immature Granulocytes: 0.07 10*3/uL (ref 0.00–0.07)
Basophils Absolute: 0 10*3/uL (ref 0.0–0.1)
Basophils Relative: 0 %
Eosinophils Absolute: 0 10*3/uL (ref 0.0–0.5)
Eosinophils Relative: 0 %
HCT: 45.4 % (ref 39.0–52.0)
Hemoglobin: 15 g/dL (ref 13.0–17.0)
Immature Granulocytes: 1 %
Lymphocytes Relative: 7 %
Lymphs Abs: 0.9 10*3/uL (ref 0.7–4.0)
MCH: 29.7 pg (ref 26.0–34.0)
MCHC: 33 g/dL (ref 30.0–36.0)
MCV: 89.9 fL (ref 80.0–100.0)
Monocytes Absolute: 0.9 10*3/uL (ref 0.1–1.0)
Monocytes Relative: 6 %
Neutro Abs: 11.6 10*3/uL — ABNORMAL HIGH (ref 1.7–7.7)
Neutrophils Relative %: 86 %
Platelets: 173 10*3/uL (ref 150–400)
RBC: 5.05 MIL/uL (ref 4.22–5.81)
RDW: 14.3 % (ref 11.5–15.5)
WBC: 13.4 10*3/uL — ABNORMAL HIGH (ref 4.0–10.5)
nRBC: 0 % (ref 0.0–0.2)

## 2024-02-06 LAB — RESP PANEL BY RT-PCR (RSV, FLU A&B, COVID)  RVPGX2
Influenza A by PCR: NEGATIVE
Influenza B by PCR: NEGATIVE
Resp Syncytial Virus by PCR: NEGATIVE
SARS Coronavirus 2 by RT PCR: NEGATIVE

## 2024-02-06 LAB — I-STAT CG4 LACTIC ACID, ED
Lactic Acid, Venous: 1.2 mmol/L (ref 0.5–1.9)
Lactic Acid, Venous: 1.7 mmol/L (ref 0.5–1.9)

## 2024-02-06 LAB — COMPREHENSIVE METABOLIC PANEL WITH GFR
ALT: 120 U/L — ABNORMAL HIGH (ref 0–44)
AST: 555 U/L — ABNORMAL HIGH (ref 15–41)
Albumin: 3.7 g/dL (ref 3.5–5.0)
Alkaline Phosphatase: 40 U/L (ref 38–126)
Anion gap: 13 (ref 5–15)
BUN: 18 mg/dL (ref 8–23)
CO2: 20 mmol/L — ABNORMAL LOW (ref 22–32)
Calcium: 9.1 mg/dL (ref 8.9–10.3)
Chloride: 99 mmol/L (ref 98–111)
Creatinine, Ser: 1.48 mg/dL — ABNORMAL HIGH (ref 0.61–1.24)
GFR, Estimated: 51 mL/min — ABNORMAL LOW (ref >60)
Glucose, Bld: 105 mg/dL — ABNORMAL HIGH (ref 70–99)
Potassium: 3.6 mmol/L (ref 3.5–5.1)
Sodium: 132 mmol/L — ABNORMAL LOW (ref 135–145)
Total Bilirubin: 1.7 mg/dL — ABNORMAL HIGH (ref 0.0–1.2)
Total Protein: 7.9 g/dL (ref 6.5–8.1)

## 2024-02-06 LAB — TROPONIN I (HIGH SENSITIVITY)
Troponin I (High Sensitivity): 38 ng/L — ABNORMAL HIGH (ref <18)
Troponin I (High Sensitivity): 52 ng/L — ABNORMAL HIGH (ref <18)

## 2024-02-06 LAB — ETHANOL: Alcohol, Ethyl (B): <15 mg/dL (ref <15)

## 2024-02-06 LAB — LIPASE, BLOOD: Lipase: 26 U/L (ref 11–51)

## 2024-02-06 LAB — MAGNESIUM: Magnesium: 1.6 mg/dL — ABNORMAL LOW (ref 1.7–2.4)

## 2024-02-06 MED ORDER — ACETAMINOPHEN 500 MG PO TABS
1000.0000 mg | ORAL_TABLET | Freq: Once | ORAL | Status: AC
  Administered 2024-02-06: 1000 mg via ORAL
  Filled 2024-02-06: qty 2

## 2024-02-06 MED ORDER — AZITHROMYCIN 500 MG PO TABS
500.0000 mg | ORAL_TABLET | Freq: Every day | ORAL | Status: DC
  Administered 2024-02-06 – 2024-02-08 (×3): 500 mg via ORAL
  Filled 2024-02-06 (×3): qty 2

## 2024-02-06 MED ORDER — LACTATED RINGERS IV BOLUS
500.0000 mL | Freq: Once | INTRAVENOUS | Status: AC
  Administered 2024-02-06: 500 mL via INTRAVENOUS

## 2024-02-06 MED ORDER — SODIUM CHLORIDE 0.9 % IV SOLN
1.0000 g | Freq: Once | INTRAVENOUS | Status: AC
  Administered 2024-02-06: 1 g via INTRAVENOUS
  Filled 2024-02-06: qty 10

## 2024-02-06 NOTE — ED Provider Notes (Signed)
 [facility] Provider Note   CSN: [id number] Arrival date & time: 02/06/24  1751     History  Chief Complaint  Patient presents with   Fever   Cough    Cameron Bates is a 68 y.o. male.  Patient is a 68 year old male with a history of hypertension, hyperlipidemia, tobacco abuse and regular alcohol use who is presenting today with several complaints.  Patient reports that he has been feeling unwell for the last few days but yesterday he was drinking with his friends reports he is not sure how much they drink but he was feeling tipsy and he was walking out to his car and knew that he was falling but could not stop himself and laid there for approximately 30 minutes.  He denies hitting his head but reports that it did take him a while to get up.  He has had some pain in his right arm but has not had any trouble walking today.  He reports he has had a cough but states that is been for a while and denies any sputum production.  He reports that having a fever of 103 when he came in today was the first time he knew he had a fever.  He denies any diarrhea, abdominal pain, nausea or vomiting.  He is denying any shortness of breath.  He denies headache.  He denies taking any prescription medications.  The history is provided by the patient.  Fever Associated symptoms: cough   Cough Associated symptoms: fever        Home Medications Prior to Admission medications   Medication Sig Start Date End Date Taking? Authorizing Provider  amLODipine  (NORVASC ) 5 MG tablet Take 1 tablet (5 mg total) by mouth daily. 01/17/23   Raspet, Betsey Brow, PA-C  aspirin  81 MG EC tablet Take 1 tablet (81 mg total) by mouth daily. Swallow whole. 09/24/20   Vernell Goldsmith, MD  hydrochlorothiazide  (MICROZIDE ) 12.5 MG capsule Take 1 capsule (12.5 mg total) by mouth daily. 09/24/20   Vernell Goldsmith, MD      Allergies    Patient has no known allergies.    Review of Systems    Review of Systems  Constitutional:  Positive for fever.  Respiratory:  Positive for cough.     Physical Exam Updated Vital Signs BP (!) 146/67   Pulse 88   Temp 99 F (37.2 C) (Oral)   Resp (!) 28   Ht 5\' 11"  (1.803 m)   Wt 96.6 kg   SpO2 94%   BMI 29.70 kg/m  Physical Exam Vitals and nursing note reviewed.  Constitutional:      General: He is not in acute distress.    Appearance: He is well-developed.  HENT:     Head: Normocephalic and atraumatic.     Mouth/Throat:     Comments: Poor dentition Eyes:     Conjunctiva/sclera: Conjunctivae normal.     Pupils: Pupils are equal, round, and reactive to light.  Cardiovascular:     Rate and Rhythm: Normal rate and regular rhythm.     Heart sounds: No murmur heard. Pulmonary:     Effort: Pulmonary effort is normal. No respiratory distress.     Breath sounds: Examination of the right-lower field reveals rhonchi. Rhonchi present. No wheezing or rales.  Abdominal:     General: There is no distension.     Palpations: Abdomen is soft.     Tenderness: There is no abdominal  tenderness. There is no guarding or rebound.  Musculoskeletal:        General: No tenderness. Normal range of motion.     Cervical back: Normal range of motion and neck supple. No tenderness.     Right lower leg: No edema.     Left lower leg: No edema.  Skin:    General: Skin is warm and dry.     Findings: No erythema or rash.  Neurological:     Mental Status: He is alert and oriented to person, place, and time. Mental status is at baseline.     Cranial Nerves: No dysarthria or facial asymmetry.     Sensory: Sensation is intact.     Motor: Motor function is intact. No weakness or pronator drift.     Coordination: Coordination is intact. Heel to [facility] Test normal.     Comments: 5 out of 5 strength in bilateral upper extremities and lower extremities  Psychiatric:        Behavior: Behavior normal.     ED Results / Procedures / Treatments   Labs (all  labs ordered are listed, but only abnormal results are displayed) Labs Reviewed  CBC WITH DIFFERENTIAL/PLATELET - Abnormal; Notable for the following components:      Result Value   WBC 13.4 (*)    Neutro Abs 11.6 (*)    All other components within normal limits  COMPREHENSIVE METABOLIC PANEL WITH GFR - Abnormal; Notable for the following components:   Sodium 132 (*)    CO2 20 (*)    Glucose, Bld 105 (*)    Creatinine, Ser 1.48 (*)    AST 555 (*)    ALT 120 (*)    Total Bilirubin 1.7 (*)    GFR, Estimated 51 (*)    All other components within normal limits  MAGNESIUM  - Abnormal; Notable for the following components:   Magnesium  1.6 (*)    All other components within normal limits  TROPONIN I (HIGH SENSITIVITY) - Abnormal; Notable for the following components:   Troponin I (High Sensitivity) 38 (*)    All other components within normal limits  TROPONIN I (HIGH SENSITIVITY) - Abnormal; Notable for the following components:   Troponin I (High Sensitivity) 52 (*)    All other components within normal limits  RESP PANEL BY RT-PCR (RSV, FLU A&B, COVID)  RVPGX2  LIPASE, BLOOD  ETHANOL  RAPID URINE DRUG SCREEN, HOSP PERFORMED  I-STAT CG4 LACTIC ACID, ED  I-STAT CG4 LACTIC ACID, ED    EKG EKG Interpretation Date/Time:  Tuesday February 06 2024 20:51:32 EDT Ventricular Rate:  83 PR Interval:  171 QRS Duration:  82 QT Interval:  363 QTC Calculation: 427 R Axis:   28  Text Interpretation: Sinus rhythm Biatrial enlargement Left ventricular hypertrophy Repol abnrm suggests ischemia, inferior leads ST elevation, consider lateral injury No significant change since last tracing Confirmed by Almond Army ([id number]) on 02/06/2024 9:13:00 PM  Radiology DG Chest Port 1 View Result Date: 02/06/2024 CLINICAL DATA:  Fever and cough. EXAM: PORTABLE CHEST 1 VIEW COMPARISON:  November 24, 2018 FINDINGS: The heart size and mediastinal contours are within normal limits. Mild hazy infiltrate is noted  within the mid to lower right lung. Mild atelectatic changes are also noted within the bilateral lung bases. No pleural effusion or pneumothorax is identified. The visualized skeletal structures are unremarkable. IMPRESSION: 1. Mild mid to lower right lung infiltrate with mild bibasilar atelectasis. Follow-up to resolution is recommended to further exclude the presence  of an underlying neoplastic process. Electronically Signed   By: Virgle Grime M.D.   On: 02/06/2024 21:25   CT Cervical Spine Wo Contrast Result Date: 02/06/2024 CLINICAL DATA:  Neck trauma EXAM: CT CERVICAL SPINE WITHOUT CONTRAST TECHNIQUE: Multidetector CT imaging of the cervical spine was performed without intravenous contrast. Multiplanar CT image reconstructions were also generated. RADIATION DOSE REDUCTION: This exam was performed according to the departmental dose-optimization program which includes automated exposure control, adjustment of the mA and/or kV according to patient size and/or use of iterative reconstruction technique. COMPARISON:  None Available. FINDINGS: Alignment: No evidence of traumatic listhesis. Loss of lordosis and anterolisthesis of C7 are likely chronic. Skull base and vertebrae: No acute fracture. Soft tissues and spinal canal: No prevertebral fluid or swelling. No visible canal hematoma. Disc levels: Moderate disc space height loss and degenerative endplate changes at C5-C6 and C6-C7. No severe spinal canal narrowing. Upper chest: No acute abnormality. Other: None. IMPRESSION: No acute fracture in the cervical spine Electronically Signed   By: Rozell Cornet M.D.   On: 02/06/2024 20:42   CT Head Wo Contrast Result Date: 02/06/2024 CLINICAL DATA:  Trauma history of by CT head EXAM: CT HEAD WITHOUT CONTRAST TECHNIQUE: Contiguous axial images were obtained from the base of the skull through the vertex without intravenous contrast. RADIATION DOSE REDUCTION: This exam was performed according to the departmental  dose-optimization program which includes automated exposure control, adjustment of the mA and/or kV according to patient size and/or use of iterative reconstruction technique. COMPARISON:  None Available. FINDINGS: Brain: No evidence of acute infarction, hemorrhage, hydrocephalus, extra-axial collection or mass lesion/mass effect. Vascular: No hyperdense vessel or unexpected calcification. Skull: Normal. Negative for fracture or focal lesion. Sinuses/Orbits: No acute finding. Other: None. IMPRESSION: No acute intracranial abnormality. Electronically Signed   By: Tyron Gallon M.D.   On: 02/06/2024 20:39    Procedures Procedures    Medications Ordered in ED Medications  cefTRIAXone  (ROCEPHIN ) 1 g in sodium chloride  0.9 % 100 mL IVPB (1 g Intravenous New Bag/Given 02/06/24 2249)  azithromycin  (ZITHROMAX ) tablet 500 mg (500 mg Oral Given 02/06/24 2249)  acetaminophen  (TYLENOL ) tablet 1,000 mg (1,000 mg Oral Given 02/06/24 1844)  lactated ringers  bolus 500 mL (500 mLs Intravenous New Bag/Given 02/06/24 2124)    ED Course/ Medical Decision Making/ A&P                                 Medical Decision Making Amount and/or Complexity of Data Reviewed External Data Reviewed: notes. Labs: ordered. Decision-making details documented in ED Course. Radiology: ordered and independent interpretation performed. Decision-making details documented in ED Course. ECG/medicine tests: ordered and independent interpretation performed. Decision-making details documented in ED Course.  Risk Prescription drug management.   Pt with multiple medical problems and comorbidities and presenting today with a complaint that caries a high risk for morbidity and mortality.  Here today with several complaints.  Initial complaint was due to a fall yesterday.  He reports that he was concerned that he may have had a stroke because he remembers falling but is not sure how long he laid there before he could get up.  However then he  states it may be because he was drinking yesterday with his friends.  However he has not felt well today either.  He reports feeling like his blood pressure may be up but does not take any medications regularly.  Upon arrival  here patient was found to have a fever of 103 and suspect that is most likely the cause of him feeling unwell.  Concern for sepsis, pneumonia.  Patient is not having symptoms classic for meningitis as he has no neck pain or headache.  He denies any chest pain or shortness of breath and does report a cough but it is been nonproductive.  He has no abdominal pain or urinary symptoms at this time.  No evidence of cellulitis on exam.  I independently interpreted patient's labs and CBC today with a leukocytosis of 13 and a normal hemoglobin of 15, CMP with AKI with a creatinine of 1.48 and transaminitis with AST of 55 and ALT of 120 with a total bilirubin of 1.7.  Patient denies any Tylenol  use but does admit to fairly consistent alcohol use which could be the cause of his elevated LFTs.  He has no abdominal pain on exam to suggest cholecystitis.  Lipase is within normal limits.  Troponin is mildly elevated at 38 and magnesium  is mildly low at 1.6.  Viral panel is pending.  Imaging is still pending.  Lactic acid pending.  11:10 PM Lactic acid is within normal limits.  I have independently visualized and interpreted pt's images today.  Chest x-ray today with a right lower lobe pneumonia which is consistent with abnormal exam finding.  Head CT without evidence of acute pathology.  Radiology reports mid to lower right lobe infiltrate with negative head and cervical CT.  Patient was given Rocephin  and azithromycin .  Will ambulate to ensure no hypoxia  11:10 PM When patient attempted to ambulate he became very wheezy and tachypneic and was having difficulty walking.  Given patient's mildly rising troponin, pneumonia and symptoms will admit for IV antibiotics.           Final Clinical  Impression(s) / ED Diagnoses Final diagnoses:  Community acquired pneumonia of right lower lobe of lung    Rx / DC Orders ED Discharge Orders     None         Almond Army, MD 02/06/24 2310

## 2024-02-06 NOTE — ED Triage Notes (Signed)
 Pt present with fever and states I felt like my BP was elevated. "I passed out yesterday" Denies dizziness or lightheadedness. Pt denies injury from passing out.

## 2024-02-06 NOTE — ED Notes (Signed)
 Patient transported to CT

## 2024-02-06 NOTE — ED Provider Triage Note (Signed)
 Emergency Medicine Provider Triage Evaluation Note  Krue Peterka , a 68 y.o. male  was evaluated in triage.  Pt complains of a syncopal episode that happened yesterday.  Reports he was walking to the bus stop when this happened.  Denies any feelings of lightheadedness, dizziness, palpitations, chest pain, or shortness of breath before or after this happened.  Reports he fell and he is unsure if he hit his head.  Denies any headache, nausea or vomiting.  He is not on any blood thinners.  Also reports concern that his blood pressure may be high, but was not able to check it at home.  Denies any other physical complaints.  Review of Systems  Positive: As above Negative: As above  Physical Exam  BP 112/80 (BP Location: Left Arm)   Pulse (!) 105   Temp (!) 103 F (39.4 C) (Oral)   Resp (!) 24   Ht 5\' 11"  (1.803 m)   Wt 96.6 kg   SpO2 92%   BMI 29.70 kg/m  Gen:   Awake, no distress   Resp:  Normal effort  MSK:   Moves extremities without difficulty    Medical Decision Making  Medically screening exam initiated at 6:44 PM.  Appropriate orders placed.  Boleslaw Borghi was informed that the remainder of the evaluation will be completed by another provider, this initial triage assessment does not replace that evaluation, and the importance of remaining in the ED until their evaluation is complete.     Rexie Catena, PA-C 02/06/24 1844

## 2024-02-07 DIAGNOSIS — J189 Pneumonia, unspecified organism: Secondary | ICD-10-CM | POA: Diagnosis not present

## 2024-02-07 LAB — CBC
HCT: 46 % (ref 39.0–52.0)
Hemoglobin: 15 g/dL (ref 13.0–17.0)
MCH: 29.6 pg (ref 26.0–34.0)
MCHC: 32.6 g/dL (ref 30.0–36.0)
MCV: 90.9 fL (ref 80.0–100.0)
Platelets: 157 10*3/uL (ref 150–400)
RBC: 5.06 MIL/uL (ref 4.22–5.81)
RDW: 14.3 % (ref 11.5–15.5)
WBC: 9 10*3/uL (ref 4.0–10.5)
nRBC: 0 % (ref 0.0–0.2)

## 2024-02-07 LAB — URINALYSIS, COMPLETE (UACMP) WITH MICROSCOPIC
Bilirubin Urine: NEGATIVE
Glucose, UA: NEGATIVE mg/dL
Ketones, ur: NEGATIVE mg/dL
Leukocytes,Ua: NEGATIVE
Nitrite: NEGATIVE
Protein, ur: 100 mg/dL — AB
Specific Gravity, Urine: 1.01 (ref 1.005–1.030)
pH: 5 (ref 5.0–8.0)

## 2024-02-07 LAB — COMPREHENSIVE METABOLIC PANEL WITH GFR
ALT: 135 U/L — ABNORMAL HIGH (ref 0–44)
AST: 629 U/L — ABNORMAL HIGH (ref 15–41)
Albumin: 3.3 g/dL — ABNORMAL LOW (ref 3.5–5.0)
Alkaline Phosphatase: 40 U/L (ref 38–126)
Anion gap: 15 (ref 5–15)
BUN: 18 mg/dL (ref 8–23)
CO2: 22 mmol/L (ref 22–32)
Calcium: 8.7 mg/dL — ABNORMAL LOW (ref 8.9–10.3)
Chloride: 98 mmol/L (ref 98–111)
Creatinine, Ser: 1.42 mg/dL — ABNORMAL HIGH (ref 0.61–1.24)
GFR, Estimated: 54 mL/min — ABNORMAL LOW (ref >60)
Glucose, Bld: 109 mg/dL — ABNORMAL HIGH (ref 70–99)
Potassium: 3.6 mmol/L (ref 3.5–5.1)
Sodium: 135 mmol/L (ref 135–145)
Total Bilirubin: 1.3 mg/dL — ABNORMAL HIGH (ref 0.0–1.2)
Total Protein: 7.4 g/dL (ref 6.5–8.1)

## 2024-02-07 LAB — HEPATITIS PANEL, ACUTE
HCV Ab: NONREACTIVE
Hep A IgM: NONREACTIVE
Hep B C IgM: NONREACTIVE
Hepatitis B Surface Ag: NONREACTIVE

## 2024-02-07 LAB — HIV ANTIBODY (ROUTINE TESTING W REFLEX): HIV Screen 4th Generation wRfx: NONREACTIVE

## 2024-02-07 LAB — MAGNESIUM: Magnesium: 1.9 mg/dL (ref 1.7–2.4)

## 2024-02-07 LAB — TROPONIN I (HIGH SENSITIVITY): Troponin I (High Sensitivity): 43 ng/L — ABNORMAL HIGH (ref <18)

## 2024-02-07 LAB — CK: Total CK: >20000 U/L — ABNORMAL HIGH (ref 49–397)

## 2024-02-07 MED ORDER — LACTATED RINGERS IV SOLN
INTRAVENOUS | Status: AC
  Administered 2024-02-07: 1000 mL via INTRAVENOUS

## 2024-02-07 MED ORDER — ONDANSETRON HCL 4 MG/2ML IJ SOLN: 4.0000 mg | Freq: Four times a day (QID) | INTRAMUSCULAR | Status: DC | PRN

## 2024-02-07 MED ORDER — THIAMINE HCL 100 MG/ML IJ SOLN: 100.0000 mg | Freq: Every day | INTRAMUSCULAR | Status: DC

## 2024-02-07 MED ORDER — FOLIC ACID 1 MG PO TABS
1.0000 mg | ORAL_TABLET | Freq: Every day | ORAL | Status: DC
  Administered 2024-02-07 – 2024-02-11 (×5): 1 mg via ORAL
  Filled 2024-02-07 (×5): qty 1

## 2024-02-07 MED ORDER — LACTATED RINGERS IV BOLUS
1000.0000 mL | Freq: Once | INTRAVENOUS | Status: AC
  Administered 2024-02-07: 1000 mL via INTRAVENOUS

## 2024-02-07 MED ORDER — LORAZEPAM 2 MG/ML IJ SOLN: 0.0000 mg | Freq: Two times a day (BID) | INTRAMUSCULAR | Status: AC

## 2024-02-07 MED ORDER — LORAZEPAM 2 MG/ML IJ SOLN
0.0000 mg | Freq: Four times a day (QID) | INTRAMUSCULAR | Status: AC
Start: 2024-02-07 — End: 2024-02-09
  Administered 2024-02-07: 2 mg via INTRAVENOUS
  Filled 2024-02-07: qty 1

## 2024-02-07 MED ORDER — LORAZEPAM 2 MG/ML IJ SOLN: 1.0000 mg | INTRAMUSCULAR | Status: DC | PRN

## 2024-02-07 MED ORDER — THIAMINE MONONITRATE 100 MG PO TABS
100.0000 mg | ORAL_TABLET | Freq: Every day | ORAL | Status: DC
  Administered 2024-02-07 – 2024-02-11 (×5): 100 mg via ORAL
  Filled 2024-02-07 (×5): qty 1

## 2024-02-07 MED ORDER — ONDANSETRON HCL 4 MG PO TABS: 4.0000 mg | ORAL_TABLET | Freq: Four times a day (QID) | ORAL | Status: DC | PRN

## 2024-02-07 MED ORDER — AZITHROMYCIN 500 MG PO TABS
500.0000 mg | ORAL_TABLET | Freq: Every day | ORAL | Status: DC
  Administered 2024-02-07: 500 mg via ORAL
  Filled 2024-02-07: qty 1
  Filled 2024-02-07: qty 2

## 2024-02-07 MED ORDER — LORAZEPAM 1 MG PO TABS: 1.0000 mg | ORAL_TABLET | ORAL | Status: DC | PRN

## 2024-02-07 MED ORDER — ADULT MULTIVITAMIN W/MINERALS CH
1.0000 | ORAL_TABLET | Freq: Every day | ORAL | Status: DC
  Administered 2024-02-07 – 2024-02-11 (×5): 1 via ORAL
  Filled 2024-02-07 (×5): qty 1

## 2024-02-07 MED ORDER — ENOXAPARIN SODIUM 40 MG/0.4ML IJ SOSY
40.0000 mg | PREFILLED_SYRINGE | INTRAMUSCULAR | Status: DC
  Administered 2024-02-07 – 2024-02-10 (×4): 40 mg via SUBCUTANEOUS
  Filled 2024-02-07 (×5): qty 0.4

## 2024-02-07 MED ORDER — SODIUM CHLORIDE 0.9 % IV SOLN
2.0000 g | INTRAVENOUS | Status: DC
  Administered 2024-02-07 – 2024-02-09 (×3): 2 g via INTRAVENOUS
  Filled 2024-02-07 (×3): qty 20

## 2024-02-07 MED ORDER — ACETAMINOPHEN 650 MG RE SUPP: 650.0000 mg | Freq: Four times a day (QID) | RECTAL | Status: DC | PRN

## 2024-02-07 MED ORDER — NICOTINE 14 MG/24HR TD PT24
14.0000 mg | MEDICATED_PATCH | Freq: Every day | TRANSDERMAL | Status: DC
  Filled 2024-02-07 (×3): qty 1

## 2024-02-07 MED ORDER — GUAIFENESIN 100 MG/5ML PO LIQD
5.0000 mL | ORAL | Status: DC | PRN
  Administered 2024-02-08: 5 mL via ORAL
  Filled 2024-02-07: qty 10

## 2024-02-07 MED ORDER — MAGNESIUM SULFATE 2 GM/50ML IV SOLN
2.0000 g | Freq: Once | INTRAVENOUS | Status: AC
  Administered 2024-02-07: 2 g via INTRAVENOUS
  Filled 2024-02-07: qty 50

## 2024-02-07 MED ORDER — ACETAMINOPHEN 325 MG PO TABS: 650.0000 mg | ORAL_TABLET | Freq: Four times a day (QID) | ORAL | Status: DC | PRN

## 2024-02-07 MED ORDER — SODIUM CHLORIDE 0.9% FLUSH
3.0000 mL | Freq: Two times a day (BID) | INTRAVENOUS | Status: DC
  Administered 2024-02-07 – 2024-02-11 (×8): 3 mL via INTRAVENOUS

## 2024-02-07 NOTE — H&P (Addendum)
 History and Physical    Cameron Bates ZOX:[id number] DOB: [birth year] DOA: 02/06/2024  PCP: Patient, No Pcp Per   Patient coming from: Home   Chief Complaint: Cough, fever, fatigue, malaise, fell while intoxicated last night   HPI: Cameron Bates is a 68 y.o. male with medical history significant for hypertension, hyperlipidemia, tobacco abuse, and excessive alcohol use who now presents with multiple complaints including fever, cough, fatigue, malaise, and a fall last night while intoxicated.  Patient reports several days of cough and general malaise.  He reports a fever to 30 F yesterday.  He was drinking alcohol yesterday, consumed much more than usual, and then fell.  He reports laying on the ground for at least 30 minutes or so before he was able to get up.  He has had an ache in his right arm since then but no headache or appreciable injury from the fall.  His urine is dark brown today which she states is new.  ED Course: Upon arrival to the ED, patient is found to be febrile 39.4 C and saturating low 90s on room air with mild tachypnea, mild tachycardia, and stable BP.  Labs are most notable for sodium 132, creatinine 1.48, AST 555, ALT 120, total bilirubin 1.7, WBC 13,400, lactate normal x 2, troponin 38 and then 52, and undetectable ethanol.  No acute findings noted on head CT or CT of the cervical spine.  Chest x-ray is concerning for pneumonia.  Patient was treated with acetaminophen , IV fluid bolus, Rocephin , and azithromycin  in the ED.  Review of Systems:  All other systems reviewed and apart from HPI, are negative.  Past Medical History:  Diagnosis Date   Hyperlipidemia    Hypertension    Tobacco abuse     History reviewed. No pertinent surgical history.  Social History:   reports that he has been smoking cigarettes. He has a 20 pack-year smoking history. He has never used smokeless tobacco. He reports current alcohol use of about 2.0 standard drinks of alcohol per week. He  reports that he does not use drugs.  No Known Allergies  Family History  Problem Relation Age of Onset   Hypertension Mother      Prior to Admission medications   Not on File    Physical Exam: Vitals:   02/07/24 0000 02/07/24 0100 02/07/24 0115 02/07/24 0132  BP:  130/74 (!) 140/71 (!) 145/89  Pulse:  (!) 36 (!) 101 (!) 104  Resp: 18     Temp:    (!) 100.8 F (38.2 C)  TempSrc:      SpO2:  (!) 89% 97%   Weight:      Height:        Constitutional: NAD, no pallor or diaphoresis  Eyes: PERTLA, lids and conjunctivae normal ENMT: Mucous membranes are moist. Posterior pharynx clear of any exudate or lesions.   Neck: supple, no masses  Respiratory: Rhonchi at right base, no wheezing. No accessory muscle use.  Cardiovascular: S1 & S2 heard, regular rate and rhythm. No extremity edema.  Abdomen: No tenderness, soft. Bowel sounds active.  Musculoskeletal: no clubbing / cyanosis. No joint deformity upper and lower extremities.   Skin: no significant rashes, lesions, ulcers. Warm, dry, well-perfused. Neurologic: CN 2-12 grossly intact. Moving all extremities. Alert and oriented.  Psychiatric: Calm. Cooperative.    Labs and Imaging on Admission: I have personally reviewed following labs and imaging studies  CBC: Recent Labs  Lab 02/06/24 1856  WBC 13.4*  NEUTROABS 11.6*  HGB 15.0  HCT 45.4  MCV 89.9  PLT 173   Basic Metabolic Panel: Recent Labs  Lab 02/06/24 1856  NA 132*  K 3.6  CL 99  CO2 20*  GLUCOSE 105*  BUN 18  CREATININE 1.48*  CALCIUM  9.1  MG 1.6*   GFR: Estimated Creatinine Clearance: 56.6 mL/min (A) (by C-G formula based on SCr of 1.48 mg/dL (H)). Liver Function Tests: Recent Labs  Lab 02/06/24 1856  AST 555*  ALT 120*  ALKPHOS 40  BILITOT 1.7*  PROT 7.9  ALBUMIN 3.7   Recent Labs  Lab 02/06/24 1856  LIPASE 26   No results for input(s): "AMMONIA" in the last 168 hours. Coagulation Profile: No results for input(s): "INR", "PROTIME"  in the last 168 hours. Cardiac Enzymes: No results for input(s): "CKTOTAL", "CKMB", "CKMBINDEX", "TROPONINI" in the last 168 hours. BNP (last 3 results) No results for input(s): "PROBNP" in the last 8760 hours. HbA1C: No results for input(s): "HGBA1C" in the last 72 hours. CBG: No results for input(s): "GLUCAP" in the last 168 hours. Lipid Profile: No results for input(s): "CHOL", "HDL", "LDLCALC", "TRIG", "CHOLHDL", "LDLDIRECT" in the last 72 hours. Thyroid Function Tests: No results for input(s): "TSH", "T4TOTAL", "FREET4", "T3FREE", "THYROIDAB" in the last 72 hours. Anemia Panel: No results for input(s): "VITAMINB12", "FOLATE", "FERRITIN", "TIBC", "IRON", "RETICCTPCT" in the last 72 hours. Urine analysis:    Component Value Date/Time   COLORURINE YELLOW 11/25/2018 2104   APPEARANCEUR CLEAR 11/25/2018 2104   LABSPEC 1.015 01/17/2023 1211   PHURINE 6.0 01/17/2023 1211   GLUCOSEU NEGATIVE 01/17/2023 1211   HGBUR SMALL (A) 01/17/2023 1211   BILIRUBINUR NEGATIVE 01/17/2023 1211   KETONESUR NEGATIVE 01/17/2023 1211   PROTEINUR NEGATIVE 01/17/2023 1211   UROBILINOGEN 2.0 (H) 01/17/2023 1211   NITRITE NEGATIVE 01/17/2023 1211   LEUKOCYTESUR NEGATIVE 01/17/2023 1211   Sepsis Labs: @LABRCNTIP (procalcitonin:4,lacticidven:4) ) Recent Results (from the past 240 hours)  Resp panel by RT-PCR (RSV, Flu A&B, Covid) Anterior Nasal Swab     Status: None   Collection Time: 02/06/24  6:42 PM   Specimen: Anterior Nasal Swab  Result Value Ref Range Status   SARS Coronavirus 2 by RT PCR NEGATIVE NEGATIVE Final   Influenza A by PCR NEGATIVE NEGATIVE Final   Influenza B by PCR NEGATIVE NEGATIVE Final    Comment: (NOTE) The Xpert Xpress SARS-CoV-2/FLU/RSV plus assay is intended as an aid in the diagnosis of influenza from Nasopharyngeal swab specimens and should not be used as a sole basis for treatment. Nasal washings and aspirates are unacceptable for Xpert Xpress  SARS-CoV-2/FLU/RSV testing.  Fact Sheet for Patients: BloggerCourse.com  Fact Sheet for Healthcare Providers: SeriousBroker.it  This test is not yet approved or cleared by the [place]  FDA and has been authorized for detection and/or diagnosis of SARS-CoV-2 by FDA under an Emergency Use Authorization (EUA). This EUA will remain in effect (meaning this test can be used) for the duration of the COVID-19 declaration under Section 564(b)(1) of the Act, 21 U.S.C. section 360bbb-3(b)(1), unless the authorization is terminated or revoked.     Resp Syncytial Virus by PCR NEGATIVE NEGATIVE Final    Comment: (NOTE) Fact Sheet for Patients: BloggerCourse.com  Fact Sheet for Healthcare Providers: SeriousBroker.it  This test is not yet approved or cleared by the [place]  FDA and has been authorized for detection and/or diagnosis of SARS-CoV-2 by FDA under an Emergency Use Authorization (EUA). This EUA will remain in effect (meaning this test can be used) for the  duration of the COVID-19 declaration under Section 564(b)(1) of the Act, 21 U.S.C. section 360bbb-3(b)(1), unless the authorization is terminated or revoked.  Performed at [facility], [address], [place], [place] [address]      Radiological Exams on Admission: DG Chest Port 1 View Result Date: 02/06/2024 CLINICAL DATA:  Fever and cough. EXAM: PORTABLE CHEST 1 VIEW COMPARISON:  November 24, 2018 FINDINGS: The heart size and mediastinal contours are within normal limits. Mild hazy infiltrate is noted within the mid to lower right lung. Mild atelectatic changes are also noted within the bilateral lung bases. No pleural effusion or pneumothorax is identified. The visualized skeletal structures are unremarkable. IMPRESSION: 1. Mild mid to lower right lung infiltrate with mild bibasilar atelectasis. Follow-up to  resolution is recommended to further exclude the presence of an underlying neoplastic process. Electronically Signed   By: Virgle Grime M.D.   On: 02/06/2024 21:25   CT Cervical Spine Wo Contrast Result Date: 02/06/2024 CLINICAL DATA:  Neck trauma EXAM: CT CERVICAL SPINE WITHOUT CONTRAST TECHNIQUE: Multidetector CT imaging of the cervical spine was performed without intravenous contrast. Multiplanar CT image reconstructions were also generated. RADIATION DOSE REDUCTION: This exam was performed according to the departmental dose-optimization program which includes automated exposure control, adjustment of the mA and/or kV according to patient size and/or use of iterative reconstruction technique. COMPARISON:  None Available. FINDINGS: Alignment: No evidence of traumatic listhesis. Loss of lordosis and anterolisthesis of C7 are likely chronic. Skull base and vertebrae: No acute fracture. Soft tissues and spinal canal: No prevertebral fluid or swelling. No visible canal hematoma. Disc levels: Moderate disc space height loss and degenerative endplate changes at C5-C6 and C6-C7. No severe spinal canal narrowing. Upper chest: No acute abnormality. Other: None. IMPRESSION: No acute fracture in the cervical spine Electronically Signed   By: Rozell Cornet M.D.   On: 02/06/2024 20:42   CT Head Wo Contrast Result Date: 02/06/2024 CLINICAL DATA:  Trauma history of by CT head EXAM: CT HEAD WITHOUT CONTRAST TECHNIQUE: Contiguous axial images were obtained from the base of the skull through the vertex without intravenous contrast. RADIATION DOSE REDUCTION: This exam was performed according to the departmental dose-optimization program which includes automated exposure control, adjustment of the mA and/or kV according to patient size and/or use of iterative reconstruction technique. COMPARISON:  None Available. FINDINGS: Brain: No evidence of acute infarction, hemorrhage, hydrocephalus, extra-axial collection or mass  lesion/mass effect. Vascular: No hyperdense vessel or unexpected calcification. Skull: Normal. Negative for fracture or focal lesion. Sinuses/Orbits: No acute finding. Other: None. IMPRESSION: No acute intracranial abnormality. Electronically Signed   By: Tyron Gallon M.D.   On: 02/06/2024 20:39    EKG: Independently reviewed. Sinus rhythm, LVH with repolarization abnormality.   Assessment/Plan   1. Pneumonia  - Continue Rocephin  and azithromycin , supportive care    2. AKI  - SCr is 1.48, up from apparent baseline of 0.8  - He fell and remained on the ground last night while intoxicated; urine is brown, raising question of rhabdomyolysis  - Check CK, UA with microscopy, continue IVF hydration, renally-dose medications, repeat chem panel in am  3. Elevated LFTs  - AST is 555, ALT 120, and total bilirubin 1.7  - No abdominal pain or N/V/D  - Check CK and for viral hepatitis, encourage alcohol avoidance, trend labs   4. Elevated troponin  - Troponin was 38 and then 52 in ED without chest discomfort and likely reflects demand ischemia in setting  of pneumonia rather than ACS  - Continue cardiac monitoring, trend troponin, treat pneumonia   5. Alcohol abuse  - Patient drinks regularly, is unable or unwilling to say how much, but notes falling down last night and having trouble getting up due to excessive drinking  - Monitor for withdrawal with CIWA, use Ativan  if needed, supplement vitamins, consult TOC    DVT prophylaxis: Lovenox   Code Status: Full  Level of Care: Level of care: Telemetry Cardiac Family Communication: None present  Disposition Plan:  Patient is from: Home  Anticipated d/c is to: home  Anticipated d/c date is: 02/09/24  Patient currently: Pending improved/stable renal function and respiratory status  Consults called: None  Admission status: Inpatient     Walton Guppy, MD [facility]  02/07/2024, 1:47 AM

## 2024-02-07 NOTE — Discharge Instructions (Signed)

## 2024-02-07 NOTE — Progress Notes (Signed)
 CSW added substance abuse resources to patient's AVS.  Edwin Dada, MSW, LCSW Transitions of Care  Clinical Social Worker II [phone]

## 2024-02-07 NOTE — Progress Notes (Signed)
 PROGRESS NOTE    Cameron Bates  ZOX:[id number] DOB: 1956-01-11 DOA: 02/06/2024 PCP: Patient, No Pcp Per    Brief Narrative:  68 year old gentleman with history of hypertension, hyperlipidemia, smoker, alcohol use, currently homeless presented with multiple complaints mainly fatigue and extreme weakness with a fall night before.  Patient reported several days of cough and malaise.  He reported temperature 103.  Day before arrival, he was drinking alcohol and fell on the ground and could not get up for about 30 minutes.  He was continuing to have pain and difficulty mobility so came to the ER. At the emergency room temperature 39.4, 90% on room air.  Mild tachypnea and tachycardia.  Blood pressure stable.  Creatinine 1.48.  AST and ALT 555/120.  WBC 13.4.  Lactic acid normal.  Troponin is 38-52.  Skeletal survey including head CT was negative.  Chest x-ray concerning for right lower lobe pneumonia.  Patient was given IV fluids, Rocephin  azithromycin  in the ER.  Subjective: Patient was seen and examined.  Still in the emergency room.  Feels weak otherwise denies any complaints.  Denies any nausea vomiting.  He has some dry cough.  Tmax 103 overnight.  WBC count 9. Assessment & Plan:   Right lower lobe pneumonia: Antibiotics to treat bacterial pneumonia, continue Rocephin  and azithromycin . Chest physiotherapy, incentive spirometry, deep breathing exercises, sputum induction, mucolytic's and bronchodilators. Sputum cultures, blood cultures, Legionella and streptococcal antigen. Supplemental oxygen to keep saturations more than 90%.  Mobilize.  Traumatic rhabdomyolysis, elevated liver enzymes: CK level more than 20,000.  Making adequate urine.  Received 500 mL Ringer lactate overnight. 1 L Ringer lactate bolus now then continue 150 mL/h.  Check CK every 12 hours.  Monitor for urine output. Elevated liver enzymes are likely secondary to rhabdomyolysis and chronic liver disease.  Will  monitor.  Alcohol abuse: Drinks regularly.  He tells me he has no history of withdrawal.  On CIWA protocol.  On multivitamins.  Hypokalemia and hypomagnesemia: Replace and keep on a scheduled replacement.  Smoker: Counseled to quit.  Provide nicotine  patch.   DVT prophylaxis: enoxaparin  (LOVENOX ) injection 40 mg Start: 02/07/24 1000   Code Status: Full code Family Communication: None at the bedside Disposition Plan: Status is: Inpatient Remains inpatient appropriate because: IV antibiotics, IV fluids     Consultants:  None  Procedures:  None  Antimicrobials:  Rocephin  and azithromycin  4/22---     Objective: Vitals:   02/07/24 0613 02/07/24 0630 02/07/24 0700 02/07/24 1100  BP:  138/83  135/81  Pulse: 92 90    Resp: 17  17 (!) 24  Temp: 100.1 F (37.8 C)     TempSrc: Oral   Oral  SpO2: 96% 94%    Weight:      Height:       No intake or output data in the 24 hours ending 02/07/24 1125 Filed Weights   02/06/24 1841  Weight: 96.6 kg    Examination:  General exam: Appears calm and comfortable.  On room air. Respiratory system: Clear to auscultation. Respiratory effort normal.  No added sounds.  He does have intermittent dry cough. Cardiovascular system: S1 & S2 heard, RRR. No JVD, murmurs, rubs, gallops or clicks. No pedal edema. Gastrointestinal system: Abdomen is nondistended, soft and nontender. No organomegaly or masses felt. Normal bowel sounds heard. Central nervous system: Alert and oriented. No focal neurological deficits. Extremities: Symmetric 5 x 5 power. Skin:  Patient has some abrasions on the left knee.    Data  Reviewed: I have personally reviewed following labs and imaging studies  CBC: Recent Labs  Lab 02/06/24 1856 02/07/24 0601  WBC 13.4* 9.0  NEUTROABS 11.6*  --   HGB 15.0 15.0  HCT 45.4 46.0  MCV 89.9 90.9  PLT 173 157   Basic Metabolic Panel: Recent Labs  Lab 02/06/24 1856 02/07/24 0621  NA 132* 135  K 3.6 3.6  CL 99  98  CO2 20* 22  GLUCOSE 105* 109*  BUN 18 18  CREATININE 1.48* 1.42*  CALCIUM  9.1 8.7*  MG 1.6* 1.9   GFR: Estimated Creatinine Clearance: 59 mL/min (A) (by C-G formula based on SCr of 1.42 mg/dL (H)). Liver Function Tests: Recent Labs  Lab 02/06/24 1856 02/07/24 0621  AST 555* 629*  ALT 120* 135*  ALKPHOS 40 40  BILITOT 1.7* 1.3*  PROT 7.9 7.4  ALBUMIN 3.7 3.3*   Recent Labs  Lab 02/06/24 1856  LIPASE 26   No results for input(s): "AMMONIA" in the last 168 hours. Coagulation Profile: No results for input(s): "INR", "PROTIME" in the last 168 hours. Cardiac Enzymes: Recent Labs  Lab 02/07/24 0621  CKTOTAL >20,000*   BNP (last 3 results) No results for input(s): "PROBNP" in the last 8760 hours. HbA1C: No results for input(s): "HGBA1C" in the last 72 hours. CBG: No results for input(s): "GLUCAP" in the last 168 hours. Lipid Profile: No results for input(s): "CHOL", "HDL", "LDLCALC", "TRIG", "CHOLHDL", "LDLDIRECT" in the last 72 hours. Thyroid Function Tests: No results for input(s): "TSH", "T4TOTAL", "FREET4", "T3FREE", "THYROIDAB" in the last 72 hours. Anemia Panel: No results for input(s): "VITAMINB12", "FOLATE", "FERRITIN", "TIBC", "IRON", "RETICCTPCT" in the last 72 hours. Sepsis Labs: Recent Labs  Lab 02/06/24 2118 02/06/24 2310  LATICACIDVEN 1.2 1.7    Recent Results (from the past 240 hours)  Resp panel by RT-PCR (RSV, Flu A&B, Covid) Anterior Nasal Swab     Status: None   Collection Time: 02/06/24  6:42 PM   Specimen: Anterior Nasal Swab  Result Value Ref Range Status   SARS Coronavirus 2 by RT PCR NEGATIVE NEGATIVE Final   Influenza A by PCR NEGATIVE NEGATIVE Final   Influenza B by PCR NEGATIVE NEGATIVE Final    Comment: (NOTE) The Xpert Xpress SARS-CoV-2/FLU/RSV plus assay is intended as an aid in the diagnosis of influenza from Nasopharyngeal swab specimens and should not be used as a sole basis for treatment. Nasal washings and aspirates  are unacceptable for Xpert Xpress SARS-CoV-2/FLU/RSV testing.  Fact Sheet for Patients: BloggerCourse.com  Fact Sheet for Healthcare Providers: SeriousBroker.it  This test is not yet approved or cleared by the [place]  FDA and has been authorized for detection and/or diagnosis of SARS-CoV-2 by FDA under an Emergency Use Authorization (EUA). This EUA will remain in effect (meaning this test can be used) for the duration of the COVID-19 declaration under Section 564(b)(1) of the Act, 21 U.S.C. section 360bbb-3(b)(1), unless the authorization is terminated or revoked.     Resp Syncytial Virus by PCR NEGATIVE NEGATIVE Final    Comment: (NOTE) Fact Sheet for Patients: BloggerCourse.com  Fact Sheet for Healthcare Providers: SeriousBroker.it  This test is not yet approved or cleared by the [place]  FDA and has been authorized for detection and/or diagnosis of SARS-CoV-2 by FDA under an Emergency Use Authorization (EUA). This EUA will remain in effect (meaning this test can be used) for the duration of the COVID-19 declaration under Section 564(b)(1) of the Act, 21 U.S.C. section 360bbb-3(b)(1), unless the authorization is  terminated or revoked.  Performed at [facility], [address], [place], [place] [address]          Radiology Studies: DG Chest Port 1 View Result Date: 02/06/2024 CLINICAL DATA:  Fever and cough. EXAM: PORTABLE CHEST 1 VIEW COMPARISON:  November 24, 2018 FINDINGS: The heart size and mediastinal contours are within normal limits. Mild hazy infiltrate is noted within the mid to lower right lung. Mild atelectatic changes are also noted within the bilateral lung bases. No pleural effusion or pneumothorax is identified. The visualized skeletal structures are unremarkable. IMPRESSION: 1. Mild mid to lower right lung infiltrate with mild bibasilar  atelectasis. Follow-up to resolution is recommended to further exclude the presence of an underlying neoplastic process. Electronically Signed   By: Virgle Grime M.D.   On: 02/06/2024 21:25   CT Cervical Spine Wo Contrast Result Date: 02/06/2024 CLINICAL DATA:  Neck trauma EXAM: CT CERVICAL SPINE WITHOUT CONTRAST TECHNIQUE: Multidetector CT imaging of the cervical spine was performed without intravenous contrast. Multiplanar CT image reconstructions were also generated. RADIATION DOSE REDUCTION: This exam was performed according to the departmental dose-optimization program which includes automated exposure control, adjustment of the mA and/or kV according to patient size and/or use of iterative reconstruction technique. COMPARISON:  None Available. FINDINGS: Alignment: No evidence of traumatic listhesis. Loss of lordosis and anterolisthesis of C7 are likely chronic. Skull base and vertebrae: No acute fracture. Soft tissues and spinal canal: No prevertebral fluid or swelling. No visible canal hematoma. Disc levels: Moderate disc space height loss and degenerative endplate changes at C5-C6 and C6-C7. No severe spinal canal narrowing. Upper chest: No acute abnormality. Other: None. IMPRESSION: No acute fracture in the cervical spine Electronically Signed   By: Rozell Cornet M.D.   On: 02/06/2024 20:42   CT Head Wo Contrast Result Date: 02/06/2024 CLINICAL DATA:  Trauma history of by CT head EXAM: CT HEAD WITHOUT CONTRAST TECHNIQUE: Contiguous axial images were obtained from the base of the skull through the vertex without intravenous contrast. RADIATION DOSE REDUCTION: This exam was performed according to the departmental dose-optimization program which includes automated exposure control, adjustment of the mA and/or kV according to patient size and/or use of iterative reconstruction technique. COMPARISON:  None Available. FINDINGS: Brain: No evidence of acute infarction, hemorrhage, hydrocephalus,  extra-axial collection or mass lesion/mass effect. Vascular: No hyperdense vessel or unexpected calcification. Skull: Normal. Negative for fracture or focal lesion. Sinuses/Orbits: No acute finding. Other: None. IMPRESSION: No acute intracranial abnormality. Electronically Signed   By: Tyron Gallon M.D.   On: 02/06/2024 20:39        Scheduled Meds:  azithromycin   500 mg Oral Daily   azithromycin   500 mg Oral QHS   enoxaparin  (LOVENOX ) injection  40 mg Subcutaneous Q24H   folic acid   1 mg Oral Daily   LORazepam   0-4 mg Intravenous Q6H   Followed by   Cecily Cohen ON 02/09/2024] LORazepam   0-4 mg Intravenous Q12H   multivitamin with minerals  1 tablet Oral Daily   nicotine   14 mg Transdermal Daily   sodium chloride  flush  3 mL Intravenous Q12H   thiamine   100 mg Oral Daily   Or   thiamine   100 mg Intravenous Daily   Continuous Infusions:  cefTRIAXone  (ROCEPHIN )  IV     lactated ringers      lactated ringers        LOS: 1 day        Vada Garibaldi, MD [facility]

## 2024-02-08 DIAGNOSIS — J189 Pneumonia, unspecified organism: Secondary | ICD-10-CM | POA: Diagnosis not present

## 2024-02-08 DIAGNOSIS — M6282 Rhabdomyolysis: Secondary | ICD-10-CM | POA: Diagnosis present

## 2024-02-08 LAB — COMPREHENSIVE METABOLIC PANEL WITH GFR
ALT: 142 U/L — ABNORMAL HIGH (ref 0–44)
AST: 527 U/L — ABNORMAL HIGH (ref 15–41)
Albumin: 2.7 g/dL — ABNORMAL LOW (ref 3.5–5.0)
Alkaline Phosphatase: 36 U/L — ABNORMAL LOW (ref 38–126)
Anion gap: 12 (ref 5–15)
BUN: 14 mg/dL (ref 8–23)
CO2: 22 mmol/L (ref 22–32)
Calcium: 8.5 mg/dL — ABNORMAL LOW (ref 8.9–10.3)
Chloride: 101 mmol/L (ref 98–111)
Creatinine, Ser: 1.02 mg/dL (ref 0.61–1.24)
GFR, Estimated: >60 mL/min (ref >60)
Glucose, Bld: 104 mg/dL — ABNORMAL HIGH (ref 70–99)
Potassium: 3.6 mmol/L (ref 3.5–5.1)
Sodium: 135 mmol/L (ref 135–145)
Total Bilirubin: 0.8 mg/dL (ref 0.0–1.2)
Total Protein: 6.5 g/dL (ref 6.5–8.1)

## 2024-02-08 LAB — CBC
HCT: 40.6 % (ref 39.0–52.0)
Hemoglobin: 13.6 g/dL (ref 13.0–17.0)
MCH: 30 pg (ref 26.0–34.0)
MCHC: 33.5 g/dL (ref 30.0–36.0)
MCV: 89.6 fL (ref 80.0–100.0)
Platelets: 161 10*3/uL (ref 150–400)
RBC: 4.53 MIL/uL (ref 4.22–5.81)
RDW: 13.9 % (ref 11.5–15.5)
WBC: 6.6 10*3/uL (ref 4.0–10.5)
nRBC: 0 % (ref 0.0–0.2)

## 2024-02-08 LAB — CK
Total CK: >20000 U/L — ABNORMAL HIGH (ref 49–397)
Total CK: >20000 U/L — ABNORMAL HIGH (ref 49–397)
Total CK: >20000 U/L — ABNORMAL HIGH (ref 49–397)

## 2024-02-08 MED ORDER — GUAIFENESIN 100 MG/5ML PO LIQD
10.0000 mL | ORAL | Status: DC | PRN
  Administered 2024-02-08: 10 mL via ORAL
  Filled 2024-02-08: qty 15

## 2024-02-08 MED ORDER — AZITHROMYCIN 500 MG PO TABS
500.0000 mg | ORAL_TABLET | Freq: Every day | ORAL | Status: DC
  Administered 2024-02-09: 500 mg via ORAL
  Filled 2024-02-08: qty 1

## 2024-02-08 NOTE — Progress Notes (Signed)
 PROGRESS NOTE    Cameron Bates  AVW:[id number] DOB: [birth year] DOA: 02/06/2024 PCP: Patient, No Pcp Per    Brief Narrative:  68 year old gentleman with history of hypertension, hyperlipidemia, smoker, alcohol use, currently homeless presented with multiple complaints mainly fatigue and extreme weakness with a fall night before.  Patient reported several days of cough and malaise.  He reported temperature 103.  Day before arrival, he was drinking alcohol and fell on the ground and could not get up for about 30 minutes.  He was continuing to have pain and difficulty mobility so came to the ER. At the emergency room temperature 39.4, 90% on room air.  Mild tachypnea and tachycardia.  Blood pressure stable.  Creatinine 1.48.  AST and ALT 555/120.  WBC 13.4.  Lactic acid normal.  Troponin is 38-52.  Skeletal survey including head CT was negative.  Chest x-ray concerning for right lower lobe pneumonia.  Patient was given IV fluids, Rocephin  azithromycin  in the ER.  Subjective: Patient seen and examined.  Still in the emergency room.  No other overnight events.  Patient tells me he feels much better and able to walk today.  CK still more than 20,000.  Kidney functions are adequate.  Urine output not measured.  Assessment & Plan:   Right lower lobe pneumonia: Antibiotics to treat bacterial pneumonia, continue Rocephin  and azithromycin . Chest physiotherapy, incentive spirometry, deep breathing exercises, sputum induction, mucolytic's and bronchodilators. Sputum cultures, blood cultures, Legionella and streptococcal antigen. Supplemental oxygen to keep saturations more than 90%.  Mobilize.  Traumatic rhabdomyolysis, elevated liver enzymes: CK level more than 20,000.  Making adequate urine.  Patient received 2 L Ringer lactate bolus and then remains on Ringer lactate 150 mL/h.  Renal functions are adequate.  Check CK every 12 hours.  Monitor for urine output. Elevated liver enzymes are likely secondary  to rhabdomyolysis and chronic liver disease.  Will monitor.  Alcohol abuse: Drinks regularly.  He tells me he has no history of withdrawal.  On CIWA protocol.  On multivitamins.  So far uncomplicated.  Hypokalemia and hypomagnesemia: Replaced.  Smoker: Counseled to quit.  Provide nicotine  patch.   DVT prophylaxis: enoxaparin  (LOVENOX ) injection 40 mg Start: 02/07/24 1000   Code Status: Full code Family Communication: None at the bedside Disposition Plan: Status is: Inpatient Remains inpatient appropriate because: IV antibiotics, IV fluids     Consultants:  None  Procedures:  None  Antimicrobials:  Rocephin  and azithromycin  4/22---     Objective: Vitals:   02/08/24 0535 02/08/24 0540 02/08/24 0800 02/08/24 1204  BP:  126/79 (!) 127/92 122/76  Pulse:  76 78 70  Resp:  (!) 23 18 18   Temp: 98.9 F (37.2 C)  98.7 F (37.1 C) 98.2 F (36.8 C)  TempSrc: Oral   Oral  SpO2:  94% 96% 95%  Weight:      Height:        Intake/Output Summary (Last 24 hours) at 02/08/2024 1334 Last data filed at 02/08/2024 0445 Gross per 24 hour  Intake --  Output 700 ml  Net -700 ml   Filed Weights   02/06/24 1841  Weight: 96.6 kg    Examination:  General exam: Appears calm and comfortable.  On room air. Alert and interactive today. Respiratory system: Clear to auscultation. Respiratory effort normal.  No added sounds.  He does have intermittent dry cough. Cardiovascular system: S1 & S2 heard, RRR. No JVD, murmurs, rubs, gallops or clicks. No pedal edema. Gastrointestinal system: Abdomen is nondistended, soft  and nontender. No organomegaly or masses felt. Normal bowel sounds heard. Central nervous system: Alert and oriented. No focal neurological deficits. Extremities: Symmetric 5 x 5 power. Skin:  Patient has some abrasions on the left knee.    Data Reviewed: I have personally reviewed following labs and imaging studies  CBC: Recent Labs  Lab 02/06/24 1856 02/07/24 0601  02/08/24 0550  WBC 13.4* 9.0 6.6  NEUTROABS 11.6*  --   --   HGB 15.0 15.0 13.6  HCT 45.4 46.0 40.6  MCV 89.9 90.9 89.6  PLT 173 157 161   Basic Metabolic Panel: Recent Labs  Lab 02/06/24 1856 02/07/24 0621 02/08/24 0550  NA 132* 135 135  K 3.6 3.6 3.6  CL 99 98 101  CO2 20* 22 22  GLUCOSE 105* 109* 104*  BUN 18 18 14   CREATININE 1.48* 1.42* 1.02  CALCIUM  9.1 8.7* 8.5*  MG 1.6* 1.9  --    GFR: Estimated Creatinine Clearance: 82.2 mL/min (by C-G formula based on SCr of 1.02 mg/dL). Liver Function Tests: Recent Labs  Lab 02/06/24 1856 02/07/24 0621 02/08/24 0550  AST 555* 629* 527*  ALT 120* 135* 142*  ALKPHOS 40 40 36*  BILITOT 1.7* 1.3* 0.8  PROT 7.9 7.4 6.5  ALBUMIN 3.7 3.3* 2.7*   Recent Labs  Lab 02/06/24 1856  LIPASE 26   No results for input(s): "AMMONIA" in the last 168 hours. Coagulation Profile: No results for input(s): "INR", "PROTIME" in the last 168 hours. Cardiac Enzymes: Recent Labs  Lab 02/07/24 0621 02/08/24 0550  CKTOTAL >20,000* >20,000*   BNP (last 3 results) No results for input(s): "PROBNP" in the last 8760 hours. HbA1C: No results for input(s): "HGBA1C" in the last 72 hours. CBG: No results for input(s): "GLUCAP" in the last 168 hours. Lipid Profile: No results for input(s): "CHOL", "HDL", "LDLCALC", "TRIG", "CHOLHDL", "LDLDIRECT" in the last 72 hours. Thyroid Function Tests: No results for input(s): "TSH", "T4TOTAL", "FREET4", "T3FREE", "THYROIDAB" in the last 72 hours. Anemia Panel: No results for input(s): "VITAMINB12", "FOLATE", "FERRITIN", "TIBC", "IRON", "RETICCTPCT" in the last 72 hours. Sepsis Labs: Recent Labs  Lab 02/06/24 2118 02/06/24 2310  LATICACIDVEN 1.2 1.7    Recent Results (from the past 240 hours)  Resp panel by RT-PCR (RSV, Flu A&B, Covid) Anterior Nasal Swab     Status: None   Collection Time: 02/06/24  6:42 PM   Specimen: Anterior Nasal Swab  Result Value Ref Range Status   SARS Coronavirus 2 by  RT PCR NEGATIVE NEGATIVE Final   Influenza A by PCR NEGATIVE NEGATIVE Final   Influenza B by PCR NEGATIVE NEGATIVE Final    Comment: (NOTE) The Xpert Xpress SARS-CoV-2/FLU/RSV plus assay is intended as an aid in the diagnosis of influenza from Nasopharyngeal swab specimens and should not be used as a sole basis for treatment. Nasal washings and aspirates are unacceptable for Xpert Xpress SARS-CoV-2/FLU/RSV testing.  Fact Sheet for Patients: BloggerCourse.com  Fact Sheet for Healthcare Providers: SeriousBroker.it  This test is not yet approved or cleared by the [place]  FDA and has been authorized for detection and/or diagnosis of SARS-CoV-2 by FDA under an Emergency Use Authorization (EUA). This EUA will remain in effect (meaning this test can be used) for the duration of the COVID-19 declaration under Section 564(b)(1) of the Act, 21 U.S.C. section 360bbb-3(b)(1), unless the authorization is terminated or revoked.     Resp Syncytial Virus by PCR NEGATIVE NEGATIVE Final    Comment: (NOTE) Fact Sheet for Patients:  BloggerCourse.com  Fact Sheet for Healthcare Providers: SeriousBroker.it  This test is not yet approved or cleared by the [place]  FDA and has been authorized for detection and/or diagnosis of SARS-CoV-2 by FDA under an Emergency Use Authorization (EUA). This EUA will remain in effect (meaning this test can be used) for the duration of the COVID-19 declaration under Section 564(b)(1) of the Act, 21 U.S.C. section 360bbb-3(b)(1), unless the authorization is terminated or revoked.  Performed at [facility], [address] [place], [place] [address]          Radiology Studies: DG Chest Port 1 View Result Date: 02/06/2024 CLINICAL DATA:  Fever and cough. EXAM: PORTABLE CHEST 1 VIEW COMPARISON:  November 24, 2018 FINDINGS: The heart size and  mediastinal contours are within normal limits. Mild hazy infiltrate is noted within the mid to lower right lung. Mild atelectatic changes are also noted within the bilateral lung bases. No pleural effusion or pneumothorax is identified. The visualized skeletal structures are unremarkable. IMPRESSION: 1. Mild mid to lower right lung infiltrate with mild bibasilar atelectasis. Follow-up to resolution is recommended to further exclude the presence of an underlying neoplastic process. Electronically Signed   By: Virgle Grime M.D.   On: 02/06/2024 21:25   CT Cervical Spine Wo Contrast Result Date: 02/06/2024 CLINICAL DATA:  Neck trauma EXAM: CT CERVICAL SPINE WITHOUT CONTRAST TECHNIQUE: Multidetector CT imaging of the cervical spine was performed without intravenous contrast. Multiplanar CT image reconstructions were also generated. RADIATION DOSE REDUCTION: This exam was performed according to the departmental dose-optimization program which includes automated exposure control, adjustment of the mA and/or kV according to patient size and/or use of iterative reconstruction technique. COMPARISON:  None Available. FINDINGS: Alignment: No evidence of traumatic listhesis. Loss of lordosis and anterolisthesis of C7 are likely chronic. Skull base and vertebrae: No acute fracture. Soft tissues and spinal canal: No prevertebral fluid or swelling. No visible canal hematoma. Disc levels: Moderate disc space height loss and degenerative endplate changes at C5-C6 and C6-C7. No severe spinal canal narrowing. Upper chest: No acute abnormality. Other: None. IMPRESSION: No acute fracture in the cervical spine Electronically Signed   By: Rozell Cornet M.D.   On: 02/06/2024 20:42   CT Head Wo Contrast Result Date: 02/06/2024 CLINICAL DATA:  Trauma history of by CT head EXAM: CT HEAD WITHOUT CONTRAST TECHNIQUE: Contiguous axial images were obtained from the base of the skull through the vertex without intravenous contrast.  RADIATION DOSE REDUCTION: This exam was performed according to the departmental dose-optimization program which includes automated exposure control, adjustment of the mA and/or kV according to patient size and/or use of iterative reconstruction technique. COMPARISON:  None Available. FINDINGS: Brain: No evidence of acute infarction, hemorrhage, hydrocephalus, extra-axial collection or mass lesion/mass effect. Vascular: No hyperdense vessel or unexpected calcification. Skull: Normal. Negative for fracture or focal lesion. Sinuses/Orbits: No acute finding. Other: None. IMPRESSION: No acute intracranial abnormality. Electronically Signed   By: Tyron Gallon M.D.   On: 02/06/2024 20:39        Scheduled Meds:  azithromycin   500 mg Oral Daily   azithromycin   500 mg Oral QHS   enoxaparin  (LOVENOX ) injection  40 mg Subcutaneous Q24H   folic acid   1 mg Oral Daily   LORazepam   0-4 mg Intravenous Q6H   Followed by   Cecily Cohen ON 02/09/2024] LORazepam   0-4 mg Intravenous Q12H   multivitamin with minerals  1 tablet Oral Daily   nicotine   14 mg Transdermal Daily  sodium chloride  flush  3 mL Intravenous Q12H   thiamine   100 mg Oral Daily   Or   thiamine   100 mg Intravenous Daily   Continuous Infusions:  cefTRIAXone  (ROCEPHIN )  IV Stopped (02/07/24 2215)   lactated ringers  150 mL/hr at 02/08/24 0759     LOS: 2 days        Vada Garibaldi, MD [facility]

## 2024-02-09 DIAGNOSIS — J189 Pneumonia, unspecified organism: Secondary | ICD-10-CM | POA: Diagnosis not present

## 2024-02-09 LAB — CBC
HCT: 41.4 % (ref 39.0–52.0)
Hemoglobin: 13.8 g/dL (ref 13.0–17.0)
MCH: 29.7 pg (ref 26.0–34.0)
MCHC: 33.3 g/dL (ref 30.0–36.0)
MCV: 89.2 fL (ref 80.0–100.0)
Platelets: 176 10*3/uL (ref 150–400)
RBC: 4.64 MIL/uL (ref 4.22–5.81)
RDW: 13.8 % (ref 11.5–15.5)
WBC: 4.5 10*3/uL (ref 4.0–10.5)
nRBC: 0 % (ref 0.0–0.2)

## 2024-02-09 LAB — CK
Total CK: >20000 U/L — ABNORMAL HIGH (ref 49–397)
Total CK: >20000 U/L — ABNORMAL HIGH (ref 49–397)

## 2024-02-09 LAB — COMPREHENSIVE METABOLIC PANEL WITH GFR
ALT: 189 U/L — ABNORMAL HIGH (ref 0–44)
AST: 590 U/L — ABNORMAL HIGH (ref 15–41)
Albumin: 2.7 g/dL — ABNORMAL LOW (ref 3.5–5.0)
Alkaline Phosphatase: 36 U/L — ABNORMAL LOW (ref 38–126)
Anion gap: 12 (ref 5–15)
BUN: 12 mg/dL (ref 8–23)
CO2: 24 mmol/L (ref 22–32)
Calcium: 9 mg/dL (ref 8.9–10.3)
Chloride: 101 mmol/L (ref 98–111)
Creatinine, Ser: 0.94 mg/dL (ref 0.61–1.24)
GFR, Estimated: >60 mL/min (ref >60)
Glucose, Bld: 104 mg/dL — ABNORMAL HIGH (ref 70–99)
Potassium: 3.5 mmol/L (ref 3.5–5.1)
Sodium: 137 mmol/L (ref 135–145)
Total Bilirubin: 0.8 mg/dL (ref 0.0–1.2)
Total Protein: 6.7 g/dL (ref 6.5–8.1)

## 2024-02-09 LAB — LEGIONELLA PNEUMOPHILA SEROGP 1 UR AG
L. pneumophila Serogp 1 Ur Ag: NEGATIVE
Source of Sample: 9985

## 2024-02-09 MED ORDER — LACTATED RINGERS IV BOLUS
2000.0000 mL | Freq: Once | INTRAVENOUS | Status: AC
  Administered 2024-02-09: 2000 mL via INTRAVENOUS

## 2024-02-09 NOTE — Progress Notes (Signed)
 PROGRESS NOTE    Kameran Mcneese  BMW:[id number] DOB: [birth year] DOA: 02/06/2024 PCP: Patient, No Pcp Per    Brief Narrative:  68 year old gentleman with history of hypertension, hyperlipidemia, smoker, alcohol use, currently homeless presented with multiple complaints mainly fatigue and extreme weakness with a fall night before.  Patient reported several days of cough and malaise.  He reported temperature 103.  Day before arrival, he was drinking alcohol and fell on the ground and could not get up for about 30 minutes.  He was continuing to have pain and difficulty mobility so came to the ER. At the emergency room temperature 39.4, 90% on room air.  Mild tachypnea and tachycardia.  Blood pressure stable.  Creatinine 1.48.  AST and ALT 555/120.  WBC 13.4.  Lactic acid normal.  Troponin is 38-52.  Skeletal survey including head CT was negative.  Chest x-ray concerning for right lower lobe pneumonia.  Patient was given IV fluids, Rocephin  azithromycin  in the ER.  Subjective: Patient seen and examined.  He is able to walk around better today.  Urine is clearing up.  CK is still more than 20,000.  On 150 mL/h.  Urine output is not measured.  Remains afebrile.  Has some dry cough.  Cultures negative so far.  Assessment & Plan:   Right lower lobe pneumonia: Treated with Rocephin  and azithromycin  that we will continue today.  Cultures are negative so far. Chest physiotherapy, incentive spirometry, deep breathing exercises, sputum induction, mucolytic's and bronchodilators. Sputum cultures, blood cultures, Legionella and streptococcal antigen negative so far. Supplemental oxygen to keep saturations more than 90%.  Mobilize.  Traumatic rhabdomyolysis, elevated liver enzymes: CK level more than 20,000.  Making adequate urine.   Patient received 2 L Ringer lactate bolus and then remains on Ringer lactate 150 mL/h.  Renal functions are adequate.  Additional 2 L Ringer lactate bolus today. Check CK every  12 hours.  Monitor for urine output. Elevated liver enzymes are likely secondary to rhabdomyolysis and chronic liver disease.  Will monitor.  Alcohol abuse: Drinks regularly.  He tells me he has no history of withdrawal.  On CIWA protocol.  On multivitamins.  So far uncomplicated.  Hypokalemia and hypomagnesemia: Replaced.  Adequate.  Smoker: Counseled to quit.  Provide nicotine  patch.  Continue to mobilize.  Anticipate discharge when CK levels improved.   DVT prophylaxis: enoxaparin  (LOVENOX ) injection 40 mg Start: 02/07/24 1000   Code Status: Full code Family Communication: None at the bedside Disposition Plan: Status is: Inpatient Remains inpatient appropriate because: IV antibiotics, IV fluids     Consultants:  None  Procedures:  None  Antimicrobials:  Rocephin  and azithromycin  4/22---     Objective: Vitals:   02/08/24 1601 02/08/24 2034 02/09/24 0551 02/09/24 0755  BP: 113/75 123/80 138/86 (!) 154/85  Pulse: 80 72 65 72  Resp: 16 18 18 18   Temp: 98.3 F (36.8 C) 98.7 F (37.1 C) 98.1 F (36.7 C) 98.4 F (36.9 C)  TempSrc: Oral  Oral   SpO2: 100% 99% 100% 97%  Weight:      Height:        Intake/Output Summary (Last 24 hours) at 02/09/2024 1127 Last data filed at 02/09/2024 1055 Gross per 24 hour  Intake 451.86 ml  Output 400 ml  Net 51.86 ml   Filed Weights   02/06/24 1841  Weight: 96.6 kg    Examination:  General exam: Appears calm and comfortable.  On room air.  Walking to the bathroom.  Alert and  interactive. Respiratory system: Clear to auscultation. Respiratory effort normal.  No added sounds.  He does have intermittent dry cough. Cardiovascular system: S1 & S2 heard, RRR. No JVD, murmurs, rubs, gallops or clicks. No pedal edema. Gastrointestinal system: Abdomen is nondistended, soft and nontender. No organomegaly or masses felt. Normal bowel sounds heard. Central nervous system: Alert and oriented. No focal neurological  deficits. Extremities: Symmetric 5 x 5 power. Skin:  Patient has some abrasions on the left knee.    Data Reviewed: I have personally reviewed following labs and imaging studies  CBC: Recent Labs  Lab 02/06/24 1856 02/07/24 0601 02/08/24 0550 02/09/24 0549  WBC 13.4* 9.0 6.6 4.5  NEUTROABS 11.6*  --   --   --   HGB 15.0 15.0 13.6 13.8  HCT 45.4 46.0 40.6 41.4  MCV 89.9 90.9 89.6 89.2  PLT 173 157 161 176   Basic Metabolic Panel: Recent Labs  Lab 02/06/24 1856 02/07/24 0621 02/08/24 0550 02/09/24 0549  NA 132* 135 135 137  K 3.6 3.6 3.6 3.5  CL 99 98 101 101  CO2 20* 22 22 24   GLUCOSE 105* 109* 104* 104*  BUN 18 18 14 12   CREATININE 1.48* 1.42* 1.02 0.94  CALCIUM  9.1 8.7* 8.5* 9.0  MG 1.6* 1.9  --   --    GFR: Estimated Creatinine Clearance: 89.1 mL/min (by C-G formula based on SCr of 0.94 mg/dL). Liver Function Tests: Recent Labs  Lab 02/06/24 1856 02/07/24 0621 02/08/24 0550 02/09/24 0549  AST 555* 629* 527* 590*  ALT 120* 135* 142* 189*  ALKPHOS 40 40 36* 36*  BILITOT 1.7* 1.3* 0.8 0.8  PROT 7.9 7.4 6.5 6.7  ALBUMIN 3.7 3.3* 2.7* 2.7*   Recent Labs  Lab 02/06/24 1856  LIPASE 26   No results for input(s): "AMMONIA" in the last 168 hours. Coagulation Profile: No results for input(s): "INR", "PROTIME" in the last 168 hours. Cardiac Enzymes: Recent Labs  Lab 02/07/24 0621 02/08/24 0550 02/08/24 1459 02/08/24 2012 02/09/24 0549  CKTOTAL >20,000* >20,000* >20,000* >20,000* >20,000*   BNP (last 3 results) No results for input(s): "PROBNP" in the last 8760 hours. HbA1C: No results for input(s): "HGBA1C" in the last 72 hours. CBG: No results for input(s): "GLUCAP" in the last 168 hours. Lipid Profile: No results for input(s): "CHOL", "HDL", "LDLCALC", "TRIG", "CHOLHDL", "LDLDIRECT" in the last 72 hours. Thyroid Function Tests: No results for input(s): "TSH", "T4TOTAL", "FREET4", "T3FREE", "THYROIDAB" in the last 72 hours. Anemia Panel: No  results for input(s): "VITAMINB12", "FOLATE", "FERRITIN", "TIBC", "IRON", "RETICCTPCT" in the last 72 hours. Sepsis Labs: Recent Labs  Lab 02/06/24 2118 02/06/24 2310  LATICACIDVEN 1.2 1.7    Recent Results (from the past 240 hours)  Resp panel by RT-PCR (RSV, Flu A&B, Covid) Anterior Nasal Swab     Status: None   Collection Time: 02/06/24  6:42 PM   Specimen: Anterior Nasal Swab  Result Value Ref Range Status   SARS Coronavirus 2 by RT PCR NEGATIVE NEGATIVE Final   Influenza A by PCR NEGATIVE NEGATIVE Final   Influenza B by PCR NEGATIVE NEGATIVE Final    Comment: (NOTE) The Xpert Xpress SARS-CoV-2/FLU/RSV plus assay is intended as an aid in the diagnosis of influenza from Nasopharyngeal swab specimens and should not be used as a sole basis for treatment. Nasal washings and aspirates are unacceptable for Xpert Xpress SARS-CoV-2/FLU/RSV testing.  Fact Sheet for Patients: BloggerCourse.com  Fact Sheet for Healthcare Providers: SeriousBroker.it  This test is not yet  approved or cleared by the [place]  FDA and has been authorized for detection and/or diagnosis of SARS-CoV-2 by FDA under an Emergency Use Authorization (EUA). This EUA will remain in effect (meaning this test can be used) for the duration of the COVID-19 declaration under Section 564(b)(1) of the Act, 21 U.S.C. section 360bbb-3(b)(1), unless the authorization is terminated or revoked.     Resp Syncytial Virus by PCR NEGATIVE NEGATIVE Final    Comment: (NOTE) Fact Sheet for Patients: BloggerCourse.com  Fact Sheet for Healthcare Providers: SeriousBroker.it  This test is not yet approved or cleared by the [place]  FDA and has been authorized for detection and/or diagnosis of SARS-CoV-2 by FDA under an Emergency Use Authorization (EUA). This EUA will remain in effect (meaning this test can be used) for  the duration of the COVID-19 declaration under Section 564(b)(1) of the Act, 21 U.S.C. section 360bbb-3(b)(1), unless the authorization is terminated or revoked.  Performed at [facility], [address], [place], [place] [address]          Radiology Studies: No results found.       Scheduled Meds:  azithromycin   500 mg Oral Daily   enoxaparin  (LOVENOX ) injection  40 mg Subcutaneous Q24H   folic acid   1 mg Oral Daily   LORazepam   0-4 mg Intravenous Q12H   multivitamin with minerals  1 tablet Oral Daily   nicotine   14 mg Transdermal Daily   sodium chloride  flush  3 mL Intravenous Q12H   thiamine   100 mg Oral Daily   Or   thiamine   100 mg Intravenous Daily   Continuous Infusions:  cefTRIAXone  (ROCEPHIN )  IV Stopped (02/08/24 2241)   lactated ringers  150 mL/hr at 02/09/24 0546     LOS: 3 days        Vada Garibaldi, MD [facility]

## 2024-02-09 NOTE — Care Management Important Message (Signed)
 Important Message  Patient Details  Name: Cameron Bates MRN: [id number] Date of Birth: 04/18/1956   Important Message Given:  Yes - Medicare IM     Wynonia Hedges 02/09/2024, 3:45 PM

## 2024-02-09 NOTE — TOC CM/SW Note (Signed)
 Transition of Care [facility]) - Inpatient Brief Assessment   Patient Details  Name: Cameron Bates MRN: [id number] Date of Birth: 01-05-1956  Transition of [facility]) CM/SW Contact:    Tom-Johnson, Angelique Ken, RN Phone Number: 02/09/2024, 4:03 PM   Clinical Narrative:  Patient presented to the ED with Cough, Fever, Fatigue and Malaise. Patient states he fell while intoxicated with friends. CXR showed Pneumonia, on IV abx, CIWA q6hrs.   Patient states he is living at the [facility]. Has three children who lives locally and a sibling in [place] .  Does not have a car, uses public transportation. Needs a cab voucher at discharge. PCP is Maryellen Snare, NP, updated on Epic and uses [employer].   Patient not Medically ready for discharge.  CM will continue to follow as patient progresses with care towards discharge.      Transition of Care Asessment: Insurance and Status: Insurance coverage has been reviewed Patient has primary care physician: Yes (Updated on Epic) Home environment has been reviewed: Homeless Shelter- Archivist Prior level of function:: Audiological scientist Home Services: No current home services Social Drivers of Health Review: SDOH reviewed no interventions necessary Readmission risk has been reviewed: Yes Transition of care needs: transition of care needs identified, TOC will continue to follow

## 2024-02-09 NOTE — Plan of Care (Signed)
  Problem: Activity: Goal: Ability to tolerate increased activity will improve Outcome: Progressing   Problem: Clinical Measurements: Goal: Ability to maintain a body temperature in the normal range will improve Outcome: Progressing   Problem: Respiratory: Goal: Ability to maintain adequate ventilation will improve Outcome: Progressing Goal: Ability to maintain a clear airway will improve Outcome: Progressing   

## 2024-02-09 NOTE — Plan of Care (Signed)
°  Problem: Activity: °Goal: Ability to tolerate increased activity will improve °Outcome: Progressing °  °Problem: Respiratory: °Goal: Ability to maintain adequate ventilation will improve °Outcome: Progressing °Goal: Ability to maintain a clear airway will improve °Outcome: Progressing °  °

## 2024-02-10 DIAGNOSIS — J189 Pneumonia, unspecified organism: Secondary | ICD-10-CM | POA: Diagnosis not present

## 2024-02-10 LAB — CBC
HCT: 40.2 % (ref 39.0–52.0)
Hemoglobin: 13.6 g/dL (ref 13.0–17.0)
MCH: 29.9 pg (ref 26.0–34.0)
MCHC: 33.8 g/dL (ref 30.0–36.0)
MCV: 88.4 fL (ref 80.0–100.0)
Platelets: 200 10*3/uL (ref 150–400)
RBC: 4.55 MIL/uL (ref 4.22–5.81)
RDW: 13.5 % (ref 11.5–15.5)
WBC: 4.6 10*3/uL (ref 4.0–10.5)
nRBC: 0 % (ref 0.0–0.2)

## 2024-02-10 LAB — COMPREHENSIVE METABOLIC PANEL WITH GFR
ALT: 195 U/L — ABNORMAL HIGH (ref 0–44)
AST: 476 U/L — ABNORMAL HIGH (ref 15–41)
Albumin: 2.7 g/dL — ABNORMAL LOW (ref 3.5–5.0)
Alkaline Phosphatase: 33 U/L — ABNORMAL LOW (ref 38–126)
Anion gap: 12 (ref 5–15)
BUN: 9 mg/dL (ref 8–23)
CO2: 25 mmol/L (ref 22–32)
Calcium: 9.5 mg/dL (ref 8.9–10.3)
Chloride: 102 mmol/L (ref 98–111)
Creatinine, Ser: 0.78 mg/dL (ref 0.61–1.24)
GFR, Estimated: >60 mL/min (ref >60)
Glucose, Bld: 108 mg/dL — ABNORMAL HIGH (ref 70–99)
Potassium: 3.7 mmol/L (ref 3.5–5.1)
Sodium: 139 mmol/L (ref 135–145)
Total Bilirubin: 0.8 mg/dL (ref 0.0–1.2)
Total Protein: 6.2 g/dL — ABNORMAL LOW (ref 6.5–8.1)

## 2024-02-10 LAB — CK
Total CK: 14130 U/L — ABNORMAL HIGH (ref 49–397)
Total CK: 10232 U/L — ABNORMAL HIGH (ref 49–397)

## 2024-02-10 MED ORDER — AMOXICILLIN-POT CLAVULANATE 875-125 MG PO TABS
1.0000 | ORAL_TABLET | Freq: Two times a day (BID) | ORAL | Status: DC
  Administered 2024-02-10 – 2024-02-11 (×3): 1 via ORAL
  Filled 2024-02-10 (×3): qty 1

## 2024-02-10 NOTE — Plan of Care (Signed)
  Problem: Activity: Goal: Ability to tolerate increased activity will improve Outcome: Adequate for Discharge   Problem: Clinical Measurements: Goal: Ability to maintain a body temperature in the normal range will improve Outcome: Adequate for Discharge   Problem: Respiratory: Goal: Ability to maintain adequate ventilation will improve Outcome: Adequate for Discharge Goal: Ability to maintain a clear airway will improve Outcome: Adequate for Discharge   

## 2024-02-10 NOTE — Progress Notes (Signed)
 PROGRESS NOTE    Cameron Bates  WUJ:[id number] DOB: 1956-01-31 DOA: 02/06/2024 PCP: Maryellen Snare, NP    Brief Narrative:  68 year old gentleman with history of hypertension, hyperlipidemia, smoker, alcohol use, currently homeless presented with multiple complaints mainly fatigue and extreme weakness with a fall night before.  Patient reported several days of cough and malaise.  He reported temperature 103.  Day before arrival, he was drinking alcohol and fell on the ground and could not get up for about 30 minutes.  He was continuing to have pain and difficulty mobility so came to the ER. At the emergency room temperature 39.4, 90% on room air.  Mild tachypnea and tachycardia.  Blood pressure stable.  Creatinine 1.48.  AST and ALT 555/120.  WBC 13.4.  Lactic acid normal.  Troponin is 38-52.  Skeletal survey including head CT was negative.  Chest x-ray concerning for right lower lobe pneumonia.  Patient was given IV fluids, Rocephin  azithromycin  in the ER.  Subjective:  Patient was seen and examined.  No overnight events.  Has some dry cough.  No other issues.  Urinates frequently.  Urine is clearing up. CK level 14,000 and trending down. Daughter on the phone.  Assessment & Plan:   Right lower lobe pneumonia: Treated with Rocephin  and azithromycin .  Negative cultures.  Not sure he aspirated during intoxication. Will change to Augmentin to treat total 7 days.  Patient will need a follow-up chest x-ray and CT lung cancer screening that he will do with primary care physician. Will mobilize in the hallway.  Traumatic rhabdomyolysis, elevated liver enzymes: Presented with CK level more than 20,000.  Making adequate urine.  Renal function stable.  Treated with multiple rounds of fluid boluses.  Now trending down. CK 14,000.  Will continue IV fluids today.  Encourage oral intake. Elevated liver enzymes are likely secondary to rhabdomyolysis and chronic liver disease.  Will monitor.  Alcohol  abuse: Drinks regularly.  He tells me he has no history of withdrawal.  On CIWA protocol.  On multivitamins.  So far uncomplicated. No evidence of withdrawals.  On multivitamins.  Hypokalemia and hypomagnesemia: Replaced.  Adequate.  Smoker: Counseled to quit.  Provide nicotine  patch.  Continue to mobilize.  Anticipate discharge when CK levels improved.   DVT prophylaxis: enoxaparin  (LOVENOX ) injection 40 mg Start: 02/07/24 1000   Code Status: Full code Family Communication: Daughter on the phone. Disposition Plan: Status is: Inpatient Remains inpatient appropriate because: IV fluids.     Consultants:  None  Procedures:  None  Antimicrobials:  Rocephin  and azithromycin  4/22--- 4/26 Augmentin 4/26---     Objective: Vitals:   02/09/24 2042 02/10/24 0000 02/10/24 0500 02/10/24 0600  BP: (!) 150/99 130/75 (!) 143/84 (!) 143/84  Pulse: 74 80 60 60  Resp:   16   Temp: 98.7 F (37.1 C)  98.6 F (37 C)   TempSrc: Oral  Oral   SpO2: 96%  96%   Weight:      Height:        Intake/Output Summary (Last 24 hours) at 02/10/2024 1124 Last data filed at 02/10/2024 0600 Gross per 24 hour  Intake 2381.75 ml  Output 875 ml  Net 1506.75 ml   Filed Weights   02/06/24 1841  Weight: 96.6 kg    Examination:  General exam: Appears calm and comfortable.  On room air.  Respiratory system: Clear to auscultation. Respiratory effort normal.  Cardiovascular system: S1 & S2 heard, RRR. No JVD, murmurs, rubs, gallops or clicks. No  pedal edema. Gastrointestinal system: Abdomen is nondistended, soft and nontender. No organomegaly or masses felt. Normal bowel sounds heard. Central nervous system: Alert and oriented. No focal neurological deficits. Extremities: Symmetric 5 x 5 power. Skin:  Patient has some abrasions on the left knee.    Data Reviewed: I have personally reviewed following labs and imaging studies  CBC: Recent Labs  Lab 02/06/24 1856 02/07/24 0601 02/08/24 0550  02/09/24 0549 02/10/24 0539  WBC 13.4* 9.0 6.6 4.5 4.6  NEUTROABS 11.6*  --   --   --   --   HGB 15.0 15.0 13.6 13.8 13.6  HCT 45.4 46.0 40.6 41.4 40.2  MCV 89.9 90.9 89.6 89.2 88.4  PLT 173 157 161 176 200   Basic Metabolic Panel: Recent Labs  Lab 02/06/24 1856 02/07/24 0621 02/08/24 0550 02/09/24 0549 02/10/24 0539  NA 132* 135 135 137 139  K 3.6 3.6 3.6 3.5 3.7  CL 99 98 101 101 102  CO2 20* 22 22 24 25   GLUCOSE 105* 109* 104* 104* 108*  BUN 18 18 14 12 9   CREATININE 1.48* 1.42* 1.02 0.94 0.78  CALCIUM  9.1 8.7* 8.5* 9.0 9.5  MG 1.6* 1.9  --   --   --    GFR: Estimated Creatinine Clearance: 104.8 mL/min (by C-G formula based on SCr of 0.78 mg/dL). Liver Function Tests: Recent Labs  Lab 02/06/24 1856 02/07/24 0621 02/08/24 0550 02/09/24 0549 02/10/24 0539  AST 555* 629* 527* 590* 476*  ALT 120* 135* 142* 189* 195*  ALKPHOS 40 40 36* 36* 33*  BILITOT 1.7* 1.3* 0.8 0.8 0.8  PROT 7.9 7.4 6.5 6.7 6.2*  ALBUMIN 3.7 3.3* 2.7* 2.7* 2.7*   Recent Labs  Lab 02/06/24 1856  LIPASE 26   No results for input(s): "AMMONIA" in the last 168 hours. Coagulation Profile: No results for input(s): "INR", "PROTIME" in the last 168 hours. Cardiac Enzymes: Recent Labs  Lab 02/08/24 1459 02/08/24 2012 02/09/24 0549 02/09/24 1600 02/10/24 0539  CKTOTAL >20,000* >20,000* >20,000* >20,000* 14,130*   BNP (last 3 results) No results for input(s): "PROBNP" in the last 8760 hours. HbA1C: No results for input(s): "HGBA1C" in the last 72 hours. CBG: No results for input(s): "GLUCAP" in the last 168 hours. Lipid Profile: No results for input(s): "CHOL", "HDL", "LDLCALC", "TRIG", "CHOLHDL", "LDLDIRECT" in the last 72 hours. Thyroid Function Tests: No results for input(s): "TSH", "T4TOTAL", "FREET4", "T3FREE", "THYROIDAB" in the last 72 hours. Anemia Panel: No results for input(s): "VITAMINB12", "FOLATE", "FERRITIN", "TIBC", "IRON", "RETICCTPCT" in the last 72 hours. Sepsis  Labs: Recent Labs  Lab 02/06/24 2118 02/06/24 2310  LATICACIDVEN 1.2 1.7    Recent Results (from the past 240 hours)  Resp panel by RT-PCR (RSV, Flu A&B, Covid) Anterior Nasal Swab     Status: None   Collection Time: 02/06/24  6:42 PM   Specimen: Anterior Nasal Swab  Result Value Ref Range Status   SARS Coronavirus 2 by RT PCR NEGATIVE NEGATIVE Final   Influenza A by PCR NEGATIVE NEGATIVE Final   Influenza B by PCR NEGATIVE NEGATIVE Final    Comment: (NOTE) The Xpert Xpress SARS-CoV-2/FLU/RSV plus assay is intended as an aid in the diagnosis of influenza from Nasopharyngeal swab specimens and should not be used as a sole basis for treatment. Nasal washings and aspirates are unacceptable for Xpert Xpress SARS-CoV-2/FLU/RSV testing.  Fact Sheet for Patients: BloggerCourse.com  Fact Sheet for Healthcare Providers: SeriousBroker.it  This test is not yet approved or cleared by  the [place]  FDA and has been authorized for detection and/or diagnosis of SARS-CoV-2 by FDA under an Emergency Use Authorization (EUA). This EUA will remain in effect (meaning this test can be used) for the duration of the COVID-19 declaration under Section 564(b)(1) of the Act, 21 U.S.C. section 360bbb-3(b)(1), unless the authorization is terminated or revoked.     Resp Syncytial Virus by PCR NEGATIVE NEGATIVE Final    Comment: (NOTE) Fact Sheet for Patients: BloggerCourse.com  Fact Sheet for Healthcare Providers: SeriousBroker.it  This test is not yet approved or cleared by the [place]  FDA and has been authorized for detection and/or diagnosis of SARS-CoV-2 by FDA under an Emergency Use Authorization (EUA). This EUA will remain in effect (meaning this test can be used) for the duration of the COVID-19 declaration under Section 564(b)(1) of the Act, 21 U.S.C. section 360bbb-3(b)(1), unless  the authorization is terminated or revoked.  Performed at [facility], [address], [place], [place] [address]          Radiology Studies: No results found.       Scheduled Meds:  amoxicillin-clavulanate  1 tablet Oral Q12H   enoxaparin  (LOVENOX ) injection  40 mg Subcutaneous Q24H   folic acid   1 mg Oral Daily   LORazepam   0-4 mg Intravenous Q12H   multivitamin with minerals  1 tablet Oral Daily   nicotine   14 mg Transdermal Daily   sodium chloride  flush  3 mL Intravenous Q12H   thiamine   100 mg Oral Daily   Or   thiamine   100 mg Intravenous Daily   Continuous Infusions:  lactated ringers  150 mL/hr at 02/09/24 1253     LOS: 4 days        Vada Garibaldi, MD [facility]

## 2024-02-11 DIAGNOSIS — T796XXA Traumatic ischemia of muscle, initial encounter: Secondary | ICD-10-CM

## 2024-02-11 DIAGNOSIS — J189 Pneumonia, unspecified organism: Secondary | ICD-10-CM | POA: Diagnosis not present

## 2024-02-11 DIAGNOSIS — F1010 Alcohol abuse, uncomplicated: Secondary | ICD-10-CM | POA: Diagnosis not present

## 2024-02-11 LAB — CK: Total CK: 5612 U/L — ABNORMAL HIGH (ref 49–397)

## 2024-02-11 NOTE — Discharge Summary (Signed)
 Physician Discharge Summary  Cameron Bates UEA:[id number] DOB: 01-02-1956 DOA: 02/06/2024  PCP: Maryellen Snare, NP  Admit date: 02/06/2024 Discharge date: 02/11/2024  Admitted From: shelter  Disposition: Homeless shelter  Recommendations for Outpatient Follow-up:  Follow up with PCP in 1-2 weeks Please obtain CMP/CBC/CK in one week Will need follow-up CT scan of the lung, send referral to pulmonary.   Discharge Condition: Stable CODE STATUS: Full code Diet recommendation: Regular diet, avoid alcohol and avoid smoking.  Discharge summary: 68 year old gentleman with history of hypertension, hyperlipidemia, smoker, alcohol use, currently homeless presented with multiple complaints mainly fatigue and extreme weakness with a fall night before.  Patient reported several days of cough and malaise.  He reported temperature 103.  Day before arrival, he was drinking alcohol and fell on the ground and could not get up for about 30 minutes.  He was continuing to have pain and difficulty mobility so came to the ER. At the emergency room temperature 39.4, 90% on room air.  Mild tachypnea and tachycardia.  Blood pressure stable.  Creatinine 1.48.  AST and ALT 555/120.  WBC 13.4.  Lactic acid normal.  Troponin was 38-52.  Skeletal survey including head CT was negative.  Chest x-ray concerning for right lower lobe pneumonia.  Patient was given IV fluids, Rocephin  azithromycin  in the ER.  CK level was more than 20,000 for consecutive 3 days.  Renal functions remained stable.  Right lower lobe pneumonia: Received antibiotics for last 5 days.  He is asymptomatic now.  Cultures are negative.  With improvement of symptoms and negative cultures and being asymptomatic today.  Will discontinue further antibiotics.   Traumatic rhabdomyolysis, elevated liver enzymes: Presented with CK level more than 20,000.  Level remained elevated for consecutive 3 to 4 days.  Patient making adequate urine.  He was treated with IV  fluids.  CK level is appropriately trending down and the serum.  10,000 today.  Patient wishes to go home.  He does not have evidence of ongoing rhabdomyolysis.  His levels are still high however this will take time to recover.  He is agreeable to follow-up with primary care physician in 1 week.  Encouraged oral hydration.    Alcohol abuse: Drinks regularly.  Did not have any evidence of complication or withdrawal.  Counseling done.  He is highly motivated to quit drinking. Electrolytes are adequate. Patient has mild abnormalities of the liver function tests, advised follow-up quit drinking alcohol. Patient is smoker, he was counseled to quit.  He did not want any nicotine  patch.   Abnormal chest x-ray: Likely due to pneumonia.  Patient is a smoker and is due for lung cancer screening.  Will send referral to pulmonary office to schedule lung cancer screening.  This was communicated with the patient and his daughter.  Discharge Diagnoses:  Principal Problem:   Pneumonia Active Problems:   Alcohol abuse   Essential hypertension   Hyperlipidemia   Elevated troponin   Elevated LFTs   Rhabdomyolysis    Discharge Instructions  Discharge Instructions     Ambulatory Referral for Lung Cancer Scre   Complete by: As directed    Diet general   Complete by: As directed    Increase activity slowly   Complete by: As directed       Allergies as of 02/11/2024   No Known Allergies      Medication List    You have not been prescribed any medications.     No Known Allergies  Consultations: None  Procedures/Studies: DG Chest Port 1 View Result Date: 02/06/2024 CLINICAL DATA:  Fever and cough. EXAM: PORTABLE CHEST 1 VIEW COMPARISON:  November 24, 2018 FINDINGS: The heart size and mediastinal contours are within normal limits. Mild hazy infiltrate is noted within the mid to lower right lung. Mild atelectatic changes are also noted within the bilateral lung bases. No pleural effusion  or pneumothorax is identified. The visualized skeletal structures are unremarkable. IMPRESSION: 1. Mild mid to lower right lung infiltrate with mild bibasilar atelectasis. Follow-up to resolution is recommended to further exclude the presence of an underlying neoplastic process. Electronically Signed   By: Virgle Grime M.D.   On: 02/06/2024 21:25   CT Cervical Spine Wo Contrast Result Date: 02/06/2024 CLINICAL DATA:  Neck trauma EXAM: CT CERVICAL SPINE WITHOUT CONTRAST TECHNIQUE: Multidetector CT imaging of the cervical spine was performed without intravenous contrast. Multiplanar CT image reconstructions were also generated. RADIATION DOSE REDUCTION: This exam was performed according to the departmental dose-optimization program which includes automated exposure control, adjustment of the mA and/or kV according to patient size and/or use of iterative reconstruction technique. COMPARISON:  None Available. FINDINGS: Alignment: No evidence of traumatic listhesis. Loss of lordosis and anterolisthesis of C7 are likely chronic. Skull base and vertebrae: No acute fracture. Soft tissues and spinal canal: No prevertebral fluid or swelling. No visible canal hematoma. Disc levels: Moderate disc space height loss and degenerative endplate changes at C5-C6 and C6-C7. No severe spinal canal narrowing. Upper chest: No acute abnormality. Other: None. IMPRESSION: No acute fracture in the cervical spine Electronically Signed   By: Rozell Cornet M.D.   On: 02/06/2024 20:42   CT Head Wo Contrast Result Date: 02/06/2024 CLINICAL DATA:  Trauma history of by CT head EXAM: CT HEAD WITHOUT CONTRAST TECHNIQUE: Contiguous axial images were obtained from the base of the skull through the vertex without intravenous contrast. RADIATION DOSE REDUCTION: This exam was performed according to the departmental dose-optimization program which includes automated exposure control, adjustment of the mA and/or kV according to patient size  and/or use of iterative reconstruction technique. COMPARISON:  None Available. FINDINGS: Brain: No evidence of acute infarction, hemorrhage, hydrocephalus, extra-axial collection or mass lesion/mass effect. Vascular: No hyperdense vessel or unexpected calcification. Skull: Normal. Negative for fracture or focal lesion. Sinuses/Orbits: No acute finding. Other: None. IMPRESSION: No acute intracranial abnormality. Electronically Signed   By: Tyron Gallon M.D.   On: 02/06/2024 20:39   (Echo, Carotid, EGD, Colonoscopy, ERCP)    Subjective: Patient seen and examined.  Dressed up.  Denies any complaints.  He was very motivated to quit drinking alcohol.   Discharge Exam: Vitals:   02/11/24 0509 02/11/24 0902  BP: 130/78 118/67  Pulse: (!) 57 65  Resp: 18   Temp: 98.3 F (36.8 C)   SpO2: 96% 96%   Vitals:   02/10/24 1606 02/10/24 2105 02/11/24 0509 02/11/24 0902  BP: (!) 143/76 138/86 130/78 118/67  Pulse: (!) 56 (!) 56 (!) 57 65  Resp: 19 17 18    Temp:  98.6 F (37 C) 98.3 F (36.8 C)   TempSrc:  Oral Oral   SpO2: 95% 100% 96% 96%  Weight:   81.9 kg   Height:        General: Pt is alert, awake, not in acute distress Cardiovascular: RRR, S1/S2 +, no rubs, no gallops Respiratory: CTA bilaterally, no wheezing, no rhonchi Abdominal: Soft, NT, ND, bowel sounds + Extremities: no edema, no cyanosis Patient has some abrasion on the left  knee.     The results of significant diagnostics from this hospitalization (including imaging, microbiology, ancillary and laboratory) are listed below for reference.     Microbiology: Recent Results (from the past 240 hours)  Resp panel by RT-PCR (RSV, Flu A&B, Covid) Anterior Nasal Swab     Status: None   Collection Time: 02/06/24  6:42 PM   Specimen: Anterior Nasal Swab  Result Value Ref Range Status   SARS Coronavirus 2 by RT PCR NEGATIVE NEGATIVE Final   Influenza A by PCR NEGATIVE NEGATIVE Final   Influenza B by PCR NEGATIVE NEGATIVE Final     Comment: (NOTE) The Xpert Xpress SARS-CoV-2/FLU/RSV plus assay is intended as an aid in the diagnosis of influenza from Nasopharyngeal swab specimens and should not be used as a sole basis for treatment. Nasal washings and aspirates are unacceptable for Xpert Xpress SARS-CoV-2/FLU/RSV testing.  Fact Sheet for Patients: BloggerCourse.com  Fact Sheet for Healthcare Providers: SeriousBroker.it  This test is not yet approved or cleared by the [place]  FDA and has been authorized for detection and/or diagnosis of SARS-CoV-2 by FDA under an Emergency Use Authorization (EUA). This EUA will remain in effect (meaning this test can be used) for the duration of the COVID-19 declaration under Section 564(b)(1) of the Act, 21 U.S.C. section 360bbb-3(b)(1), unless the authorization is terminated or revoked.     Resp Syncytial Virus by PCR NEGATIVE NEGATIVE Final    Comment: (NOTE) Fact Sheet for Patients: BloggerCourse.com  Fact Sheet for Healthcare Providers: SeriousBroker.it  This test is not yet approved or cleared by the [place]  FDA and has been authorized for detection and/or diagnosis of SARS-CoV-2 by FDA under an Emergency Use Authorization (EUA). This EUA will remain in effect (meaning this test can be used) for the duration of the COVID-19 declaration under Section 564(b)(1) of the Act, 21 U.S.C. section 360bbb-3(b)(1), unless the authorization is terminated or revoked.  Performed at [facility], [address] [place], [place] [address]      Labs: BNP (last 3 results) No results for input(s): "BNP" in the last 8760 hours. Basic Metabolic Panel: Recent Labs  Lab 02/06/24 1856 02/07/24 0621 02/08/24 0550 02/09/24 0549 02/10/24 0539  NA 132* 135 135 137 139  K 3.6 3.6 3.6 3.5 3.7  CL 99 98 101 101 102  CO2 20* 22 22 24 25   GLUCOSE 105* 109* 104*  104* 108*  BUN 18 18 14 12 9   CREATININE 1.48* 1.42* 1.02 0.94 0.78  CALCIUM  9.1 8.7* 8.5* 9.0 9.5  MG 1.6* 1.9  --   --   --    Liver Function Tests: Recent Labs  Lab 02/06/24 1856 02/07/24 0621 02/08/24 0550 02/09/24 0549 02/10/24 0539  AST 555* 629* 527* 590* 476*  ALT 120* 135* 142* 189* 195*  ALKPHOS 40 40 36* 36* 33*  BILITOT 1.7* 1.3* 0.8 0.8 0.8  PROT 7.9 7.4 6.5 6.7 6.2*  ALBUMIN 3.7 3.3* 2.7* 2.7* 2.7*   Recent Labs  Lab 02/06/24 1856  LIPASE 26   No results for input(s): "AMMONIA" in the last 168 hours. CBC: Recent Labs  Lab 02/06/24 1856 02/07/24 0601 02/08/24 0550 02/09/24 0549 02/10/24 0539  WBC 13.4* 9.0 6.6 4.5 4.6  NEUTROABS 11.6*  --   --   --   --   HGB 15.0 15.0 13.6 13.8 13.6  HCT 45.4 46.0 40.6 41.4 40.2  MCV 89.9 90.9 89.6 89.2 88.4  PLT 173 157 161 176 200  Cardiac Enzymes: Recent Labs  Lab 02/08/24 2012 02/09/24 0549 02/09/24 1600 02/10/24 0539 02/10/24 1716  CKTOTAL >20,000* >20,000* >20,000* 14,130* 10,232*   BNP: Invalid input(s): "POCBNP" CBG: No results for input(s): "GLUCAP" in the last 168 hours. D-Dimer No results for input(s): "DDIMER" in the last 72 hours. Hgb A1c No results for input(s): "HGBA1C" in the last 72 hours. Lipid Profile No results for input(s): "CHOL", "HDL", "LDLCALC", "TRIG", "CHOLHDL", "LDLDIRECT" in the last 72 hours. Thyroid function studies No results for input(s): "TSH", "T4TOTAL", "T3FREE", "THYROIDAB" in the last 72 hours.  Invalid input(s): "FREET3" Anemia work up No results for input(s): "VITAMINB12", "FOLATE", "FERRITIN", "TIBC", "IRON", "RETICCTPCT" in the last 72 hours. Urinalysis    Component Value Date/Time   COLORURINE YELLOW 02/07/2024 1523   APPEARANCEUR CLOUDY (A) 02/07/2024 1523   LABSPEC 1.010 02/07/2024 1523   PHURINE 5.0 02/07/2024 1523   GLUCOSEU NEGATIVE 02/07/2024 1523   HGBUR LARGE (A) 02/07/2024 1523   BILIRUBINUR NEGATIVE 02/07/2024 1523   KETONESUR NEGATIVE  02/07/2024 1523   PROTEINUR 100 (A) 02/07/2024 1523   UROBILINOGEN 2.0 (H) 01/17/2023 1211   NITRITE NEGATIVE 02/07/2024 1523   LEUKOCYTESUR NEGATIVE 02/07/2024 1523   Sepsis Labs Recent Labs  Lab 02/07/24 0601 02/08/24 0550 02/09/24 0549 02/10/24 0539  WBC 9.0 6.6 4.5 4.6   Microbiology Recent Results (from the past 240 hours)  Resp panel by RT-PCR (RSV, Flu A&B, Covid) Anterior Nasal Swab     Status: None   Collection Time: 02/06/24  6:42 PM   Specimen: Anterior Nasal Swab  Result Value Ref Range Status   SARS Coronavirus 2 by RT PCR NEGATIVE NEGATIVE Final   Influenza A by PCR NEGATIVE NEGATIVE Final   Influenza B by PCR NEGATIVE NEGATIVE Final    Comment: (NOTE) The Xpert Xpress SARS-CoV-2/FLU/RSV plus assay is intended as an aid in the diagnosis of influenza from Nasopharyngeal swab specimens and should not be used as a sole basis for treatment. Nasal washings and aspirates are unacceptable for Xpert Xpress SARS-CoV-2/FLU/RSV testing.  Fact Sheet for Patients: BloggerCourse.com  Fact Sheet for Healthcare Providers: SeriousBroker.it  This test is not yet approved or cleared by the [place]  FDA and has been authorized for detection and/or diagnosis of SARS-CoV-2 by FDA under an Emergency Use Authorization (EUA). This EUA will remain in effect (meaning this test can be used) for the duration of the COVID-19 declaration under Section 564(b)(1) of the Act, 21 U.S.C. section 360bbb-3(b)(1), unless the authorization is terminated or revoked.     Resp Syncytial Virus by PCR NEGATIVE NEGATIVE Final    Comment: (NOTE) Fact Sheet for Patients: BloggerCourse.com  Fact Sheet for Healthcare Providers: SeriousBroker.it  This test is not yet approved or cleared by the [place]  FDA and has been authorized for detection and/or diagnosis of SARS-CoV-2 by FDA under an  Emergency Use Authorization (EUA). This EUA will remain in effect (meaning this test can be used) for the duration of the COVID-19 declaration under Section 564(b)(1) of the Act, 21 U.S.C. section 360bbb-3(b)(1), unless the authorization is terminated or revoked.  Performed at [facility], [address], [place], [place] [address]      Time coordinating discharge:  35 minutes  SIGNED:   Vada Garibaldi, MD  [facility] 02/11/2024, 9:49 AM

## 2024-02-11 NOTE — Progress Notes (Signed)
 Cameron Bates to be discharged Home (shelter) per MD order. Discussed prescriptions and follow up appointments with the patient. Prescriptions given to patient; medication list explained in detail. Patient verbalized understanding.  Skin clean, dry and intact without evidence of skin break down, no evidence of skin tears noted. IV catheter discontinued intact. Site without signs and symptoms of complications. Dressing and pressure applied. Pt denies pain at the site currently. No complaints noted.  Patient free of lines, drains, and wounds.   An After Visit Summary (AVS) was printed and given to the patient. Patient escorted via wheelchair, and discharged home.  He is going to catch bus.  Deberah Falconer, RN

## 2024-02-11 NOTE — Plan of Care (Signed)
 Patient AAOx4, independent. No complaints of pain. Safety precautions maintained.  Discharged by charge nurse.   Problem: Activity: Goal: Ability to tolerate increased activity will improve Outcome: Progressing   Problem: Clinical Measurements: Goal: Ability to maintain clinical measurements within normal limits will improve Outcome: Progressing   Problem: Safety: Goal: Ability to remain free from injury will improve Outcome: Progressing

## 2024-02-11 NOTE — Progress Notes (Signed)
 Mobility Specialist Progress Note:   02/11/24 0902  Therapy Vitals  Pulse Rate 65  BP 118/67  Oxygen Therapy  SpO2 96 %  O2 Device Room Air  Mobility  Activity Ambulated independently in hallway  Level of Assistance Independent  Assistive Device None  Distance Ambulated (ft) 250 ft  Activity Response Tolerated well  Mobility Referral Yes  Mobility visit 1 Mobility  Mobility Specialist Start Time (ACUTE ONLY) [id number]  Mobility Specialist Stop Time (ACUTE ONLY) 0854  Mobility Specialist Time Calculation (min) (ACUTE ONLY) 3 min   Received pt in bed having no complaints and agreeable to mobility. Pt was asymptomatic throughout ambulation and returned to room w/o fault. Left on EOB w/ call bell in reach and all needs met.   D'Vante Nolon Baxter Mobility Specialist Please contact via Special educational needs teacher or Rehab office at [phone]

## 2024-02-11 NOTE — TOC Transition Note (Signed)
 Transition of Care [facility]) - Discharge Note   Patient Details  Name: Cameron Bates MRN: [id number] Date of Birth: 08/03/1956  Transition of Care [facility] CM/SW Contact:  Jannine Meo, RN Phone Number: 02/11/2024, 10:12 AM   Clinical Narrative:   Patient is being discharged today. Spoke with patient by phone, reports that he plans to catch the bus at discharge and that he has money for it.     Final next level of care: Homeless Shelter Barriers to Discharge: No Barriers Identified   Patient Goals and CMS Choice            Discharge Placement                       Discharge Plan and Services Additional resources added to the After Visit Summary for                                       Social Drivers of Health (SDOH) Interventions SDOH Screenings   Food Insecurity: No Food Insecurity (02/09/2024)  Housing: Low Risk  (02/09/2024)  Transportation Needs: No Transportation Needs (02/09/2024)  Utilities: Not At Risk (02/09/2024)  Depression (PHQ2-9): Low Risk  (09/24/2020)  Social Connections: Socially Isolated (02/09/2024)  Tobacco Use: High Risk (02/06/2024)     Readmission Risk Interventions    02/09/2024    3:59 PM  Readmission Risk Prevention Plan  Post Dischage Appt Complete  Medication Screening Complete  Transportation Screening Complete

## 2024-02-13 ENCOUNTER — Encounter: Payer: Self-pay | Admitting: *Deleted

## 2024-02-19 ENCOUNTER — Other Ambulatory Visit: Payer: Self-pay | Admitting: Student

## 2024-02-19 DIAGNOSIS — J189 Pneumonia, unspecified organism: Secondary | ICD-10-CM

## 2024-03-07 ENCOUNTER — Encounter: Payer: Self-pay | Admitting: Student

## 2024-03-08 ENCOUNTER — Ambulatory Visit
Admission: RE | Admit: 2024-03-08 | Discharge: 2024-03-08 | Disposition: A | Discharge status: Home / Self Care | Source: Ambulatory Visit | Attending: Student | Admitting: Student

## 2024-03-08 DIAGNOSIS — J189 Pneumonia, unspecified organism: Secondary | ICD-10-CM

## 2024-03-12 ENCOUNTER — Other Ambulatory Visit: Payer: Self-pay | Admitting: Family

## 2024-03-12 DIAGNOSIS — J189 Pneumonia, unspecified organism: Secondary | ICD-10-CM
